# Patient Record
Sex: Male | Born: 1955 | Race: White | Hispanic: No | Smoking: Never smoker
Health system: Southern US, Community
[De-identification: ages and names within clinical notes are randomized; demographics above are authoritative.]

## PROBLEM LIST (undated history)

## (undated) DIAGNOSIS — M75102 Unspecified rotator cuff tear or rupture of left shoulder, not specified as traumatic: Secondary | ICD-10-CM

## (undated) DIAGNOSIS — K2970 Gastritis, unspecified, without bleeding: Secondary | ICD-10-CM

## (undated) DIAGNOSIS — K219 Gastro-esophageal reflux disease without esophagitis: Secondary | ICD-10-CM

## (undated) DIAGNOSIS — K259 Gastric ulcer, unspecified as acute or chronic, without hemorrhage or perforation: Secondary | ICD-10-CM

## (undated) DIAGNOSIS — E785 Hyperlipidemia, unspecified: Secondary | ICD-10-CM

## (undated) HISTORY — PX: INGUINAL HERNIA REPAIR: SUR1180

## (undated) HISTORY — DX: Hyperlipidemia, unspecified: E78.5

---

## 2001-11-06 ENCOUNTER — Ambulatory Visit (HOSPITAL_COMMUNITY): Admission: RE | Admit: 2001-11-06 | Discharge: 2001-11-06 | Payer: Self-pay | Admitting: Family Medicine

## 2001-11-06 ENCOUNTER — Encounter: Payer: Self-pay | Admitting: Family Medicine

## 2010-04-18 ENCOUNTER — Encounter: Payer: Self-pay | Admitting: Sports Medicine

## 2010-04-18 ENCOUNTER — Ambulatory Visit (INDEPENDENT_AMBULATORY_CARE_PROVIDER_SITE_OTHER): Payer: BC Managed Care – PPO | Admitting: Sports Medicine

## 2010-04-18 DIAGNOSIS — M79609 Pain in unspecified limb: Secondary | ICD-10-CM

## 2010-04-26 NOTE — Assessment & Plan Note (Signed)
Summary: NP,POST PT,L LEG INJURY,MC   Vital Signs:  Patient profile:   55 year old male Height:      74 inches Weight:      195 pounds BMI:     25.13 Pulse rate:   79 / minute BP sitting:   123 / 79  Vitals Entered By: Rochele Pages RN (April 18, 2010 12:25 PM) CC: L medial upper leg pain   CC:  L medial upper leg pain.  History of Present Illness: Left medial thigh pain since June 2011- started the next morning after a soccer practice.  Saw [facility]- who sent him to PT for 6 weeks, now doing HEP- feels he has improved only slightly with this.   Still feels pain in inner lt thigh with sudden stablization movements.  Feels pain with certain movements in  [place].  Running does not cause pain, cycling does not cause pain.    Preventive Screening-Counseling & Management  Alcohol-Tobacco     Smoking Status: never  Social History: Smoking Status:  never  Physical Exam  General:  Well-developed,well-nourished,in no acute distress; alert,appropriate and cooperative throughout examination Msk:  Hip rotation good bilat FABER tight bilat Lt Hip flexion strength good Lt hip abduction strength good Weak on adduction lt HS strength good bilat Sartorius strength good bilat   Additional Exam:   musculoskeletal ultrasound shows a gap at the insertion of the abductor tendon into the pubic bone. this is hypoechoic with a significant amount of change seen alone and trans view.    there is a small avulsion spur noted at the insertion of the tendon in the area of probable tear. There is also significant increase in Doppler flow seen throughout the area of injury.   Impression & Recommendations:  Problem # 1:  THIGH PAIN (ICD-729.5)  Orders: Garment,belt,sleeve or other covering ,elastic or similar stretch (Z6109) [place] LIMITED (60454)   we will try him on a nitroglycerin protocol. Have him continue to do some limited exercises for the hip girdle. Recheck this in 4 weeks with a repeat  scan.  Problem # 2:  PAIN IN SOFT TISSUES OF LIMB (ICD-729.5)   he also has been in orthotics for number of years. he has a flattened arch and used to get lower leg and foot pain. the orthotics are one bilaterally and are causing him some right forefoot pain. we tried him in a sports insole which was comfortable and allowed him to have a neutral gait with running.  Orders: Sports Insoles [id number])  Complete Medication List: 1)  Nitroglycerin 0.2 Mg/hr Pt24 (Nitroglycerin) .... Apply 1/4 patch to affected area daily.  change patch every 24 hours. Prescriptions: NITROGLYCERIN 0.2 MG/HR PT24 (NITROGLYCERIN) Apply 1/4 patch to affected area daily.  Change patch every 24 hours.  #30 x 1   Entered and Authorized by:   Enid Baas MD   Signed by:   Enid Baas MD on 04/18/2010   Method used:   Electronically to        [facility] [phone]* (retail)       [address]       [place], [place]  [address]       Ph: [phone] or [phone]       Fax: [phone]   RxID:   [id number]    Orders Added: 1)  Garment,belt,sleeve or other covering ,elastic or similar stretch [A4466] 2)  New Patient Level II [99202] 3)  [place] LIMITED [76882] 4)  Sports Insoles [L3510]

## 2010-06-02 ENCOUNTER — Ambulatory Visit (INDEPENDENT_AMBULATORY_CARE_PROVIDER_SITE_OTHER): Payer: BC Managed Care – PPO | Admitting: Sports Medicine

## 2010-06-02 VITALS — BP 114/74 | HR 64

## 2010-06-02 DIAGNOSIS — IMO0002 Reserved for concepts with insufficient information to code with codable children: Secondary | ICD-10-CM

## 2010-06-02 DIAGNOSIS — S76219A Strain of adductor muscle, fascia and tendon of unspecified thigh, initial encounter: Secondary | ICD-10-CM

## 2010-06-02 NOTE — Assessment & Plan Note (Signed)
Continue NTG patches, stretches, body helix sleeve. Resume light running. Follow up in 2 months.

## 2010-06-02 NOTE — Progress Notes (Signed)
  Subjective:    Patient ID: Brent Chapman, male    DOB: 05-28-1955, 55 y.o.   MRN: [id number]  HPI  1) Left adductor strain: Here for follow up. Last seen 6 weeks ago for left medial upper thigh pain since June 2011. Started on NTG patches, reports mild headache at times with this. Reports 60% improvement in pain. Able to start running and biking again. Continues to do physical therapy. Pain exacerbated by adduction, external rotation type movements.   Review of Systems     Objective:   Physical Exam General: Well-developed,well-nourished,in no acute distress; alert,appropriate and cooperative throughout examination  Msk:  Tender to palpation at insertion of adductor tendon at left groin.  Good strength with hip abduction bilaterally Decreased strength left as compared to right hip adduction. Guarding with hip external rotation on left past 30 degrees. 45 degrees hip external rotation on right. 15 degrees internal rotation on left, 30 degrees on right  Good hip flexion and extension bilaterally  U/S: gap at the insertion of the adductor tendon. Somewhat decreased amount of fluid seen. Continued increased Doppler flow noted.  Small avulsion spur noted at the insertion of the tendon in the area of probable tear.      Assessment & Plan:

## 2011-01-10 ENCOUNTER — Ambulatory Visit (INDEPENDENT_AMBULATORY_CARE_PROVIDER_SITE_OTHER): Payer: BC Managed Care – PPO | Admitting: Sports Medicine

## 2011-01-10 VITALS — BP 118/78

## 2011-01-10 DIAGNOSIS — S76319A Strain of muscle, fascia and tendon of the posterior muscle group at thigh level, unspecified thigh, initial encounter: Secondary | ICD-10-CM | POA: Insufficient documentation

## 2011-01-10 DIAGNOSIS — M75102 Unspecified rotator cuff tear or rupture of left shoulder, not specified as traumatic: Secondary | ICD-10-CM

## 2011-01-10 DIAGNOSIS — IMO0002 Reserved for concepts with insufficient information to code with codable children: Secondary | ICD-10-CM

## 2011-01-10 DIAGNOSIS — M67919 Unspecified disorder of synovium and tendon, unspecified shoulder: Secondary | ICD-10-CM

## 2011-01-10 HISTORY — DX: Unspecified rotator cuff tear or rupture of left shoulder, not specified as traumatic: M75.102

## 2011-01-10 NOTE — Progress Notes (Signed)
  Subjective:    Patient ID: Brent Chapman, male    DOB: 1955-11-07, 55 y.o.   MRN: [id number]  HPI  Has rt medial HS insertion pain x 2 months. Started the night before a 5k, pain continued after the race the next day.  Sitting, driving, going up stairs make the pain worse. Does not have rt knee swelling or giving out.  Has more pain when wearing shoes without good heel support.  Lt posterior shoulder pain x 2 months. Has pain with ER, reaching behind is a problem.     Review of Systems     Objective:   Physical Exam  NAD Leg lengths equal Neg shake home test Full motion of lt knee Lt knee exam normal   Full flexion of rt knee Neg Mcmurray's, Lachman, and collateral ligament test on rt Good rt HS strength with foot IR, ER, and neutral positions No sign of baker's cyst on exam on rt   Lt shoulder exam: ER and IR clicking in anterior capsule of lt shoulder Empty can neg on lt Resisted ER and IR strong Hawkins and neer's positive Back scratch on lt limited Push off test strong  O'brein's positive   MSK [place] RT post knee visualized No baker's cyst There is hypoechoic change at insertion of semimembranosus Insertion of gastroc norm Some bursal swelling under semimemb tendon insertion as well No tears No abnormal doppler      Assessment & Plan:

## 2011-01-10 NOTE — Assessment & Plan Note (Signed)
Given standard set of HS rehab exercises  Do daily  Reck at 6 weeks  Advised to try body helix compression wrap

## 2011-01-10 NOTE — Patient Instructions (Signed)
Please do suggested exercises for hamstring and shoulder daily  Please follow up in 6 weeks.  Use lift in shoes  OK to try easy running  Thank you for seeing [place] today.

## 2011-01-10 NOTE — Assessment & Plan Note (Signed)
Begin with a basic rehab program Keep elevation to 90 deg or less Prn NSAIDs Reck on RTC  If not improving we will ck MSK [place]

## 2011-01-28 ENCOUNTER — Other Ambulatory Visit: Payer: Self-pay | Admitting: Sports Medicine

## 2011-08-11 ENCOUNTER — Encounter: Payer: Self-pay | Admitting: Family Medicine

## 2011-08-11 ENCOUNTER — Ambulatory Visit (INDEPENDENT_AMBULATORY_CARE_PROVIDER_SITE_OTHER): Payer: BC Managed Care – PPO | Admitting: Family Medicine

## 2011-08-11 VITALS — BP 116/72 | Ht 74.0 in | Wt 195.0 lb

## 2011-08-11 DIAGNOSIS — M25579 Pain in unspecified ankle and joints of unspecified foot: Secondary | ICD-10-CM

## 2011-08-11 DIAGNOSIS — M25571 Pain in right ankle and joints of right foot: Secondary | ICD-10-CM

## 2011-08-11 NOTE — Assessment & Plan Note (Signed)
Ultrasound reassuring.  No obvious injury and no evidence of stress fracture.  Most consistent with post tib tendinopathy.  Continue orthotics, ASO provided for more support.  Icing, elevation, nsaids regularly.  Start rehab in 1 week (home exercises demonstrated).  Discussed return to running.  Cross train in meantime.

## 2011-08-11 NOTE — Patient Instructions (Addendum)
Your ultrasound is negative for a stress fracture. This is consistent however with increased fluid in your posterior tibialis tendon sheath consistent with posterior tibialis tendinopathy. Take ibuprofen 600 mg three times a day with food OR aleve 2 tabs twice a day with food for pain and inflammation for 1 week then as needed. Ice ankle 15 minutes at a time 3-4 times a day and after exercise. Cross train while this is painful with swimming or cycling (elliptical if this is not painful). Wear your orthotics regularly, ankle brace if this is comfortable also. After a week start the trash can and calf raise exercises I showed you - 3 sets of 10 most days of the week for 6 weeks. Follow up with me in 1 month to 6 weeks or as needed. Ok to resume running as long as not limping and pain is not worse than a 3 on a scale of 1-10.

## 2011-08-11 NOTE — Progress Notes (Signed)
  Subjective:    Patient ID: ROALD LUKACS, male    DOB: 1955/06/02, 56 y.o.   MRN: [id number]  PCP: Dr. Tiburcio Pea  HPI 56 yo M here for right ankle pain.  Patient reports he runs about 2 miles every other day as well as walks his dog daily. Day before swelling and pain started (wednesday 7/3) he ran around a grass field 2 miles and had walked his dog. No obvious injury but when he woke up next morning had pain medially along with swelling. No prior history of gout or inflammatory disease. Has been icing and using advil. No prior injury.  History reviewed. No pertinent past medical history.  No current outpatient prescriptions on file prior to visit.    History reviewed. No pertinent past surgical history.  No Known Allergies  History   Social History  . Marital Status: Married    Spouse Name: N/A    Number of Children: N/A  . Years of Education: N/A   Occupational History  . Not on file.   Social History Main Topics  . Smoking status: Never Smoker   . Smokeless tobacco: Not on file  . Alcohol Use: Not on file  . Drug Use: Not on file  . Sexually Active: Not on file   Other Topics Concern  . Not on file   Social History Narrative  . No narrative on file    Family History  Problem Relation Age of Onset  . Sudden death Neg Hx   . Hypertension Neg Hx   . Hyperlipidemia Neg Hx   . Heart attack Neg Hx   . Diabetes Neg Hx     BP 116/72  Ht 6\' 2"  (1.88 m)  Wt 195 lb (88.451 kg)  BMI 25.04 kg/m2  Review of Systems See HPI above.    Objective:   Physical Exam Gen: NAD  R ankle: Mod medial swelling but no bruising, warmth, erythema.  No other deformity. FROM with mild pain on IR, less on plantar flexion.  5/5 strength. TTP course of post tib tendon, deltoid ligament.  Minimal medial malleolus and medial talus TTP.  No other TTP about foot or ankle. Negative ant drawer and talar tilt.   Negative syndesmotic compression. Thompsons test negative. NV  intact distally.  L ankle: FROM without pain or swelling  MSK u/s: R ankle - target sign of post tib tendon with apparent soft tissue swelling superficial to this.  No irregularities, neovascularity, cortical edema of medial malleolus or talar dome.  Post tib tendon intact throughout course.  No other abnormalities.    Assessment & Plan:  1. Right ankle pain - Ultrasound reassuring.  No obvious injury and no evidence of stress fracture.  Most consistent with post tib tendinopathy.  Continue orthotics, ASO provided for more support.  Icing, elevation, nsaids regularly.  Start rehab in 1 week (home exercises demonstrated).  Discussed return to running.  Cross train in meantime.

## 2011-08-29 ENCOUNTER — Ambulatory Visit: Payer: BC Managed Care – PPO | Admitting: Sports Medicine

## 2011-09-06 ENCOUNTER — Ambulatory Visit (INDEPENDENT_AMBULATORY_CARE_PROVIDER_SITE_OTHER): Payer: BC Managed Care – PPO | Admitting: Family Medicine

## 2011-09-06 ENCOUNTER — Encounter: Payer: Self-pay | Admitting: Family Medicine

## 2011-09-06 VITALS — BP 119/75 | HR 66 | Ht 74.0 in | Wt 195.0 lb

## 2011-09-06 DIAGNOSIS — M75102 Unspecified rotator cuff tear or rupture of left shoulder, not specified as traumatic: Secondary | ICD-10-CM

## 2011-09-06 DIAGNOSIS — M719 Bursopathy, unspecified: Secondary | ICD-10-CM

## 2011-09-06 NOTE — Assessment & Plan Note (Signed)
2/2 rotator cuff impingement, subscap strain.  Continue home exercise program - handout provided and demonstrated exercises.  Declined PT (has done in past for right side).  Subacromial injection given today.  Consider nitro patches, further imaging if not improving over next 6 weeks as expected.  After informed written consent, patient was seated on exam table. Left shoulder was prepped with alcohol swab and utilizing posterior approach, patient's left subacromial space was injected with 3:1 marcaine: depomedrol. Patient tolerated the procedure well without immediate complications.

## 2011-09-06 NOTE — Patient Instructions (Addendum)
You have rotator cuff impingement and subscapularis strain (one of the rotator cuff muscles). Try to avoid painful activities (overhead activities, lifting with extended arm) as much as possible. Aleve and/or tylenol as needed for pain. Subacromial injection may be beneficial to help with pain and to decrease inflammation - you were given this today. Avoid heavy lifting (especially overhead) for 5-7 days. Do home exercise program with theraband and scapular stabilization exercises daily - these are very important for long term relief even if an injection was given - wait until the weekend to start these. If not improving at follow-up we will consider further imaging and/or nitro patches.

## 2011-09-06 NOTE — Progress Notes (Signed)
  Subjective:    Patient ID: Brent Chapman, male    DOB: 17-Mar-1955, 56 y.o.   MRN: [id number]  PCP: Dr. Tiburcio Pea  HPI 56 yo M here for left shoulder pain.  Patient reports back in September when with his daughter at college he recalls reaching out to side to pose for a picture (arm behind someone's back) and developed some soreness left shoulder after this. Has had pain mostly with overhead and reaching activities since. Pain worse when lying on left side. No swelling or bruising. Is right handed. Went to [employer] office - dx with rotator cuff syndrome and given home exercise program - had some improvement with this but pain persisting. Had prior issues with right shoulder and did PT - knows all exercises from this. Not waking up at night. Has not had cortisone shot or tried nitro patches for this.  Past Medical History  Diagnosis Date  . Hyperlipidemia     Current Outpatient Prescriptions on File Prior to Visit  Medication Sig Dispense Refill  . atorvastatin (LIPITOR) 20 MG tablet         History reviewed. No pertinent past surgical history.  No Known Allergies  History   Social History  . Marital Status: Married    Spouse Name: N/A    Number of Children: N/A  . Years of Education: N/A   Occupational History  . Not on file.   Social History Main Topics  . Smoking status: Never Smoker   . Smokeless tobacco: Not on file  . Alcohol Use: Not on file  . Drug Use: Not on file  . Sexually Active: Not on file   Other Topics Concern  . Not on file   Social History Narrative  . No narrative on file    Family History  Problem Relation Age of Onset  . Sudden death Neg Hx   . Hypertension Neg Hx   . Hyperlipidemia Neg Hx   . Heart attack Neg Hx   . Diabetes Neg Hx     BP 119/75  Pulse 66  Ht 6\' 2"  (1.88 m)  Wt 195 lb (88.451 kg)  BMI 25.04 kg/m2  Review of Systems See HPI above.    Objective:   Physical Exam Gen: NAD  L shoulder: No swelling,  ecchymoses.  No gross deformity. No TTP at [facility] or biceps tendon. FROM with painful arc. Positive Hawkins, Neers. Negative Speeds, Yergasons. Strength 5/5 with empty can and resisted internal/external rotation.  Pain with IR only. Negative apprehension. NV intact distally.  R shoulder: FROM without pain or weakness.    Assessment & Plan:  1. Left shoulder pain - 2/2 rotator cuff impingement, subscap strain.  Continue home exercise program - handout provided and demonstrated exercises.  Declined PT (has done in past for right side).  Subacromial injection given today.  Consider nitro patches, further imaging if not improving over next 6 weeks as expected.  After informed written consent, patient was seated on exam table. Left shoulder was prepped with alcohol swab and utilizing posterior approach, patient's left subacromial space was injected with 3:1 marcaine: depomedrol. Patient tolerated the procedure well without immediate complications.

## 2011-09-20 ENCOUNTER — Ambulatory Visit: Payer: BC Managed Care – PPO | Admitting: Sports Medicine

## 2012-11-13 ENCOUNTER — Ambulatory Visit (INDEPENDENT_AMBULATORY_CARE_PROVIDER_SITE_OTHER): Payer: BC Managed Care – PPO | Admitting: Family Medicine

## 2012-11-13 ENCOUNTER — Encounter: Payer: Self-pay | Admitting: Family Medicine

## 2012-11-13 VITALS — BP 107/76 | HR 83 | Ht 74.0 in | Wt 200.0 lb

## 2012-11-13 DIAGNOSIS — M79609 Pain in unspecified limb: Secondary | ICD-10-CM

## 2012-11-13 DIAGNOSIS — M79671 Pain in right foot: Secondary | ICD-10-CM

## 2012-11-13 NOTE — Patient Instructions (Signed)
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 Icing 15 minutes at a time 3-4 times a day as needed. Ibuprofen 600mg  three times a day with food OR aleve 2 tabs twice a day with food of pain and inflammation. Do home exercises 3 sets of 10 of each once a day for next 6 weeks. Dr. Jari Sportsman active series insoles - wear when running and even when walking if it hurts you doing this. Follow up with me in 6 weeks or as needed.

## 2012-11-14 ENCOUNTER — Encounter: Payer: Self-pay | Admitting: Family Medicine

## 2012-11-14 DIAGNOSIS — M79671 Pain in right foot: Secondary | ICD-10-CM | POA: Insufficient documentation

## 2012-11-14 NOTE — Assessment & Plan Note (Signed)
consistent with peroneal tendinopathy.  U/S reassuring, no bony tenderness.  Icing, nsaids.  Shown home exercise program.  Arch supports regularly when up and walking around.  Running as tolerated.  F/u in 6 weeks.

## 2012-11-14 NOTE — Progress Notes (Signed)
Patient ID: ELZIE KNISLEY, male   DOB: 1955/07/06, 57 y.o.   MRN: [id number]  PCP: [name], MD  Subjective:   HPI: Patient is a 57 y.o. male here for right foot pain.  Patient reports about 2 years ago ran in shoes that were too small - developed pain in lateral right foot, resolved with bigger shoes. Current pain started about 1 1/2 months ago. Noticed lateral foot pain worse with running. Has rested for 1 month but still bothers him with walking. No swelling, bruising. Has been icing, taking ibuprofen. No injury.  Past Medical History  Diagnosis Date  . Hyperlipidemia     Current Outpatient Prescriptions on File Prior to Visit  Medication Sig Dispense Refill  . atorvastatin (LIPITOR) 20 MG tablet        No current facility-administered medications on file prior to visit.    History reviewed. No pertinent past surgical history.  No Known Allergies  History   Social History  . Marital Status: Married    Spouse Name: N/A    Number of Children: N/A  . Years of Education: N/A   Occupational History  . Not on file.   Social History Main Topics  . Smoking status: Never Smoker   . Smokeless tobacco: Not on file  . Alcohol Use: Not on file  . Drug Use: Not on file  . Sexual Activity: Not on file   Other Topics Concern  . Not on file   Social History Narrative  . No narrative on file    Family History  Problem Relation Age of Onset  . Sudden death Neg Hx   . Hypertension Neg Hx   . Hyperlipidemia Neg Hx   . Heart attack Neg Hx   . Diabetes Neg Hx     BP 107/76  Pulse 83  Ht 6\' 2"  (1.88 m)  Wt 200 lb (90.719 kg)  BMI 25.67 kg/m2  Review of Systems: See HPI above.    Objective:  Physical Exam:  Gen: NAD  Right foot/ankle: No gross deformity, swelling, ecchymoses.  Mod overpronation. FROM with pain on ext rotation mildly.  Strength 5/5 all directions. TTP just proximal to insertion of peroneus brevis on base 5th.  No cuboid, other  TTP. Negative ant drawer and talar tilt.   Negative syndesmotic compression. Negative metatarsal squeeze. Thompsons test negative. NV intact distally.    MSK u/s:  No cortical irregularity, neovascularity, edema overlying cortex of right 5th MT.  Assessment & Plan:  1. Right foot pain - consistent with peroneal tendinopathy.  U/S reassuring, no bony tenderness.  Icing, nsaids.  Shown home exercise program.  Arch supports regularly when up and walking around.  Running as tolerated.  F/u in 6 weeks.

## 2013-12-11 ENCOUNTER — Encounter (HOSPITAL_COMMUNITY): Payer: Self-pay | Admitting: *Deleted

## 2013-12-11 ENCOUNTER — Inpatient Hospital Stay (HOSPITAL_COMMUNITY)
Admission: EM | Admit: 2013-12-11 | Discharge: 2013-12-13 | DRG: 379 | Disposition: A | Discharge status: Home / Self Care | Payer: BC Managed Care – PPO | Attending: Internal Medicine | Admitting: Internal Medicine

## 2013-12-11 DIAGNOSIS — K625 Hemorrhage of anus and rectum: Secondary | ICD-10-CM | POA: Diagnosis present

## 2013-12-11 DIAGNOSIS — K269 Duodenal ulcer, unspecified as acute or chronic, without hemorrhage or perforation: Secondary | ICD-10-CM | POA: Diagnosis present

## 2013-12-11 DIAGNOSIS — I959 Hypotension, unspecified: Secondary | ICD-10-CM | POA: Diagnosis present

## 2013-12-11 DIAGNOSIS — K264 Chronic or unspecified duodenal ulcer with hemorrhage: Principal | ICD-10-CM | POA: Diagnosis present

## 2013-12-11 DIAGNOSIS — Z79899 Other long term (current) drug therapy: Secondary | ICD-10-CM

## 2013-12-11 DIAGNOSIS — K922 Gastrointestinal hemorrhage, unspecified: Secondary | ICD-10-CM

## 2013-12-11 DIAGNOSIS — K449 Diaphragmatic hernia without obstruction or gangrene: Secondary | ICD-10-CM

## 2013-12-11 DIAGNOSIS — D696 Thrombocytopenia, unspecified: Secondary | ICD-10-CM | POA: Diagnosis present

## 2013-12-11 DIAGNOSIS — D62 Acute posthemorrhagic anemia: Secondary | ICD-10-CM | POA: Diagnosis present

## 2013-12-11 DIAGNOSIS — E785 Hyperlipidemia, unspecified: Secondary | ICD-10-CM | POA: Diagnosis present

## 2013-12-11 DIAGNOSIS — K2981 Duodenitis with bleeding: Secondary | ICD-10-CM | POA: Diagnosis present

## 2013-12-11 DIAGNOSIS — K921 Melena: Secondary | ICD-10-CM | POA: Diagnosis present

## 2013-12-11 HISTORY — DX: Unspecified rotator cuff tear or rupture of left shoulder, not specified as traumatic: M75.102

## 2013-12-11 HISTORY — DX: Gastritis, unspecified, without bleeding: K29.70

## 2013-12-11 LAB — CBC
HCT: 27.6 % — ABNORMAL LOW (ref 39.0–52.0)
Hemoglobin: 9.6 g/dL — ABNORMAL LOW (ref 13.0–17.0)
MCH: 31.9 pg (ref 26.0–34.0)
MCHC: 34.8 g/dL (ref 30.0–36.0)
MCV: 91.7 fL (ref 78.0–100.0)
PLATELETS: 106 10*3/uL — AB (ref 150–400)
RBC: 3.01 MIL/uL — ABNORMAL LOW (ref 4.22–5.81)
RDW: 12.3 % (ref 11.5–15.5)
WBC: 5.3 10*3/uL (ref 4.0–10.5)

## 2013-12-11 LAB — HEMOGLOBIN AND HEMATOCRIT, BLOOD
HEMATOCRIT: 29.5 % — AB (ref 39.0–52.0)
HEMATOCRIT: 27.9 % — AB (ref 39.0–52.0)
HEMOGLOBIN: 9.7 g/dL — AB (ref 13.0–17.0)
Hemoglobin: 10.1 g/dL — ABNORMAL LOW (ref 13.0–17.0)

## 2013-12-11 LAB — ABO/RH: ABO/RH(D): A POS

## 2013-12-11 LAB — HEPATIC FUNCTION PANEL
ALK PHOS: 33 U/L — AB (ref 39–117)
ALT: 15 U/L (ref 0–53)
AST: 16 U/L (ref 0–37)
Albumin: 2.8 g/dL — ABNORMAL LOW (ref 3.5–5.2)
BILIRUBIN TOTAL: 0.4 mg/dL (ref 0.3–1.2)
Total Protein: 4.9 g/dL — ABNORMAL LOW (ref 6.0–8.3)

## 2013-12-11 LAB — TYPE AND SCREEN
ABO/RH(D): A POS
ANTIBODY SCREEN: NEGATIVE

## 2013-12-11 LAB — BASIC METABOLIC PANEL
ANION GAP: 7 (ref 5–15)
BUN: 37 mg/dL — ABNORMAL HIGH (ref 6–23)
CHLORIDE: 109 meq/L (ref 96–112)
CO2: 25 mEq/L (ref 19–32)
Calcium: 7.6 mg/dL — ABNORMAL LOW (ref 8.4–10.5)
Creatinine, Ser: 0.99 mg/dL (ref 0.50–1.35)
GFR, EST NON AFRICAN AMERICAN: 90 mL/min — AB (ref >90)
Glucose, Bld: 99 mg/dL (ref 70–99)
POTASSIUM: 5 meq/L (ref 3.7–5.3)
SODIUM: 141 meq/L (ref 137–147)

## 2013-12-11 LAB — TROPONIN I

## 2013-12-11 LAB — POC OCCULT BLOOD, ED: Fecal Occult Bld: POSITIVE — AB

## 2013-12-11 MED ORDER — ACETAMINOPHEN 325 MG PO TABS
650.0000 mg | ORAL_TABLET | Freq: Four times a day (QID) | ORAL | Status: DC | PRN
  Administered 2013-12-12: 650 mg via ORAL
  Filled 2013-12-11: qty 2

## 2013-12-11 MED ORDER — SODIUM CHLORIDE 0.9 % IV SOLN: INTRAVENOUS | Status: DC

## 2013-12-11 MED ORDER — PROMETHAZINE HCL 25 MG PO TABS: 12.5000 mg | ORAL_TABLET | Freq: Four times a day (QID) | ORAL | Status: DC | PRN

## 2013-12-11 MED ORDER — ACETAMINOPHEN 650 MG RE SUPP
650.0000 mg | Freq: Four times a day (QID) | RECTAL | Status: DC | PRN
  Administered 2013-12-11: 650 mg via RECTAL
  Filled 2013-12-11: qty 1

## 2013-12-11 MED ORDER — PANTOPRAZOLE SODIUM 40 MG IV SOLR
40.0000 mg | Freq: Once | INTRAVENOUS | Status: AC
  Administered 2013-12-11: 40 mg via INTRAVENOUS
  Filled 2013-12-11: qty 40

## 2013-12-11 MED ORDER — SODIUM CHLORIDE 0.9 % IV SOLN
INTRAVENOUS | Status: DC
  Administered 2013-12-11: 16:00:00 via INTRAVENOUS
  Administered 2013-12-12: 1000 mL via INTRAVENOUS
  Administered 2013-12-12: 250 mL via INTRAVENOUS
  Administered 2013-12-12: 02:00:00 via INTRAVENOUS

## 2013-12-11 MED ORDER — ATORVASTATIN CALCIUM 20 MG PO TABS
20.0000 mg | ORAL_TABLET | Freq: Every day | ORAL | Status: DC
  Administered 2013-12-12: 20 mg via ORAL
  Filled 2013-12-11 (×3): qty 1

## 2013-12-11 MED ORDER — PANTOPRAZOLE SODIUM 40 MG IV SOLR
40.0000 mg | Freq: Two times a day (BID) | INTRAVENOUS | Status: DC
  Administered 2013-12-11 – 2013-12-13 (×4): 40 mg via INTRAVENOUS
  Filled 2013-12-11 (×5): qty 40

## 2013-12-11 MED ORDER — SODIUM CHLORIDE 0.9 % IV BOLUS (SEPSIS)
1000.0000 mL | Freq: Once | INTRAVENOUS | Status: AC
  Administered 2013-12-11: 1000 mL via INTRAVENOUS

## 2013-12-11 NOTE — Consult Note (Signed)
Reason for Consult: Bloody stools/?Melena and anemia. Referring Physician: THP  Brent Chapman is an 58 y.o. male.  HPI: 58 year old white male, who was in his usual state of health till 3 days ago, when he started having nausea and vomiting after dinner. He claims he vomited several times that night but denies having any CGE or hematemesis. He did not feel well on Tuesday 12/09/13 and stayed home from work. He works as an Automotive engineer. Yesterday he started having diarrhea where he passed "purple colored stool". He has several such loose BM's. He denies having any syncope or near syncope till he visited his PCP this afternoon and was found to be hypotensive on exam with a pre-syncopal episode and was then transferred to [facility] by EMS. He has not had a BM since 6 AM today. He denies having a previous history of ulcers. He uses Advil or Aleve occasionally for aches and pain but does not take large doses of NSAIDS. He denies having any problems with reflux, dysphagia or odynophagia. He gives a history of having a colonoscopy about 6 years ago when he had a couple of polyps removed. This reportedly was done at [facility]. He denies a family history of colon or stomach cancer.   Past Medical History  Diagnosis Date  . Hyperlipidemia   . Gastritis     NSAID induced  . Rotator cuff syndrome of left shoulder 01/10/2011    Classic impingement findings on exam    History reviewed. No pertinent past surgical history.  Family History  Problem Relation Age of Onset  . Sudden death Neg Hx   . Hypertension Neg Hx   . Hyperlipidemia Neg Hx   . Heart attack Neg Hx   . Diabetes Neg Hx   . Cancer Father     Prostate   Social History:  reports that he has never smoked. He has never used smokeless tobacco. He reports that he drinks alcohol. He reports that he does not use illicit drugs. He drinks 1-2 beers per week.   Allergies: No Known Allergies  Medications: I have reviewed the  patient's current medications.  Results for orders placed or performed during the hospital encounter of 12/11/13 (from the past 48 hour(s))  CBC     Status: Abnormal   Collection Time: 12/11/13 11:10 AM  Result Value Ref Range   WBC 5.3 4.0 - 10.5 K/uL   RBC 3.01 (L) 4.22 - 5.81 MIL/uL   Hemoglobin 9.6 (L) 13.0 - 17.0 g/dL   HCT 27.6 (L) 39.0 - 52.0 %   MCV 91.7 78.0 - 100.0 fL   MCH 31.9 26.0 - 34.0 pg   MCHC 34.8 30.0 - 36.0 g/dL   RDW 12.3 11.5 - 15.5 %   Platelets 106 (L) 150 - 400 K/uL    Comment: SPECIMEN CHECKED FOR CLOTS REPEATED TO VERIFY PLATELET COUNT CONFIRMED BY SMEAR   Basic metabolic panel     Status: Abnormal   Collection Time: 12/11/13 11:10 AM  Result Value Ref Range   Sodium 141 137 - 147 mEq/L   Potassium 5.0 3.7 - 5.3 mEq/L   Chloride 109 96 - 112 mEq/L   CO2 25 19 - 32 mEq/L   Glucose, Bld 99 70 - 99 mg/dL   BUN 37 (H) 6 - 23 mg/dL   Creatinine, Ser 0.99 0.50 - 1.35 mg/dL   Calcium 7.6 (L) 8.4 - 10.5 mg/dL   GFR calc non Af Amer 90 (L) >  90 mL/min   GFR calc Af Amer >90 >90 mL/min    Comment: (NOTE) The eGFR has been calculated using the CKD EPI equation. This calculation has not been validated in all clinical situations. eGFR's persistently <90 mL/min signify possible Chronic Kidney Disease.    Anion gap 7 5 - 15  Troponin I     Status: None   Collection Time: 12/11/13 11:10 AM  Result Value Ref Range   Troponin I <0.30 <0.30 ng/mL    Comment:        Due to the release kinetics of cTnI, a negative result within the first hours of the onset of symptoms does not rule out myocardial infarction with certainty. If myocardial infarction is still suspected, repeat the test at appropriate intervals.   Hepatic function panel     Status: Abnormal   Collection Time: 12/11/13 11:10 AM  Result Value Ref Range   Total Protein 4.9 (L) 6.0 - 8.3 g/dL   Albumin 2.8 (L) 3.5 - 5.2 g/dL   AST 16 0 - 37 U/L   ALT 15 0 - 53 U/L   Alkaline Phosphatase 33 (L)  39 - 117 U/L   Total Bilirubin 0.4 0.3 - 1.2 mg/dL   Bilirubin, Direct <0.2 0.0 - 0.3 mg/dL   Indirect Bilirubin NOT CALCULATED 0.3 - 0.9 mg/dL  Type and screen for Red Blood Exchange     Status: None   Collection Time: 12/11/13 11:11 AM  Result Value Ref Range   ABO/RH(D) A POS    Antibody Screen NEG    Sample Expiration 12/14/2013   ABO/Rh     Status: None   Collection Time: 12/11/13 11:11 AM  Result Value Ref Range   ABO/RH(D) A POS   POC occult blood, ED Provider will collect     Status: Abnormal   Collection Time: 12/11/13 11:16 AM  Result Value Ref Range   Fecal Occult Bld POSITIVE (A) NEGATIVE  Hemoglobin and hematocrit, blood     Status: Abnormal   Collection Time: 12/11/13  2:36 PM  Result Value Ref Range   Hemoglobin 10.1 (L) 13.0 - 17.0 g/dL   HCT 29.5 (L) 39.0 - 52.0 %  Review of Systems  Constitutional: Negative.   HENT: Negative.   Eyes: Negative.   Respiratory: Negative.   Cardiovascular: Negative.   Gastrointestinal: Positive for nausea, vomiting, diarrhea, blood in stool and melena. Negative for abdominal pain.  Genitourinary: Negative.   Musculoskeletal: Negative.   Neurological: Negative.   Endo/Heme/Allergies: Negative.   Psychiatric/Behavioral: Negative.    Blood pressure 116/64, pulse 65, temperature 98.5 F (36.9 C), temperature source Oral, resp. rate 10, height 6' 2"  (1.88 m), weight 85.911 kg (189 lb 6.4 oz), SpO2 100 %. Physical Exam  Constitutional: He is oriented to person, place, and time. He appears well-developed and well-nourished.  HENT:  Head: Normocephalic and atraumatic.  Eyes: Conjunctivae and EOM are normal. Pupils are equal, round, and reactive to light.  Neck: Normal range of motion. Neck supple.  Cardiovascular: Normal rate and regular rhythm.   Respiratory: Effort normal and breath sounds normal.  GI: Soft. Bowel sounds are normal. He exhibits no distension and no mass. There is no tenderness. There is no rebound and no  guarding.  Musculoskeletal: Normal range of motion.  Neurological: He is alert and oriented to person, place, and time.  Skin: Skin is warm and dry.  Psychiatric: He has a normal mood and affect. His behavior is normal. Judgment and thought content normal.  Assessment/Plan: 1) Bloody stools with anemia, elevated BUN and hypotension: will allow the patient some clears and plan on doing an EGD tomorrow-to rule out PUD vs Dieulafoy's etc. 2) History of colonic polyps.  3) Thrombocytopenia.  4) Hyperlipidemia.  Malloree Raboin 12/11/2013, 7:58 PM

## 2013-12-11 NOTE — ED Notes (Signed)
Per EMS, [facility].   Pt had diarrhea with n/v on Sunday.  Since yesterday, he has been having diarrhea with bleeding.  Went to MD today.  Pt was hypotensive.  D/T symptoms, pt transported via ambulance to [facility].  Vitals 90/68, increase to 113/70, hr 64, resp 18.  cbg 117.  250 cc saline in 20 g LAC.

## 2013-12-11 NOTE — ED Notes (Signed)
MD at bedside. 

## 2013-12-11 NOTE — ED Provider Notes (Signed)
CSN: [id number]     Arrival date & time 12/11/13  1038 History   First MD Initiated Contact with Patient 12/11/13 1044     Chief Complaint  Patient presents with  . Blood In Stools  . Hypotension     (Consider location/radiation/quality/duration/timing/severity/associated sxs/prior Treatment) Patient is a 58 y.o. male presenting with hematochezia.  Rectal Bleeding Quality:  Maroon Amount:  Moderate Duration:  24 hours Timing:  Constant Progression:  Worsening Chronicity:  New Context comment:  Took a few NSAIDs this weekend.  Vomiting started 4 days ago, then resolved.  Nausea remained.  melanic stools started yesterday Relieved by:  Nothing Worsened by:  Defecation Associated symptoms: dizziness (near syncope at doctor's office earlier today) and vomiting   Associated symptoms: no fever and no hematemesis   Risk factors: no anticoagulant use     Past Medical History  Diagnosis Date  . Hyperlipidemia    History reviewed. No pertinent past surgical history. Family History  Problem Relation Age of Onset  . Sudden death Neg Hx   . Hypertension Neg Hx   . Hyperlipidemia Neg Hx   . Heart attack Neg Hx   . Diabetes Neg Hx    History  Substance Use Topics  . Smoking status: Never Smoker   . Smokeless tobacco: Not on file  . Alcohol Use: Not on file    Review of Systems  Constitutional: Negative for fever.  Gastrointestinal: Positive for vomiting and hematochezia. Negative for hematemesis.  Neurological: Positive for dizziness (near syncope at doctor's office earlier today).  All other systems reviewed and are negative.     Allergies  Review of patient's allergies indicates no known allergies.  Home Medications   Prior to Admission medications   Medication Sig Start Date End Date Taking? Authorizing Provider  atorvastatin (LIPITOR) 20 MG tablet  08/25/11   Historical Provider, MD   BP 115/72 mmHg  Pulse 59  Temp(Src) 97.4 F (36.3 C) (Oral)  Resp 11  SpO2  100% Physical Exam  Constitutional: He is oriented to person, place, and time. He appears well-developed and well-nourished. No distress.  HENT:  Head: Normocephalic and atraumatic.  Mouth/Throat: Oropharynx is clear and moist.  Eyes: Conjunctivae are normal. Pupils are equal, round, and reactive to light. No scleral icterus.  Neck: Neck supple.  Cardiovascular: Normal rate, regular rhythm, normal heart sounds and intact distal pulses.   No murmur heard. Pulmonary/Chest: Effort normal and breath sounds normal. No stridor. No respiratory distress. He has no wheezes. He has no rales.  Abdominal: Soft. He exhibits no distension. There is tenderness (minimal) in the epigastric area. There is no rebound and no guarding.  Genitourinary: Rectal exam shows no mass and no tenderness. Guaiac positive stool (maroon stool).  Musculoskeletal: Normal range of motion. He exhibits no edema.  Neurological: He is alert and oriented to person, place, and time.  Skin: Skin is warm and dry. No rash noted.  Psychiatric: He has a normal mood and affect. His behavior is normal.  Vitals reviewed.   ED Course  Procedures (including critical care time) Labs Review Labs Reviewed  CBC - Abnormal; Notable for the following:    RBC 3.01 (*)    Hemoglobin 9.6 (*)    HCT 27.6 (*)    Platelets 106 (*)    All other components within normal limits  BASIC METABOLIC PANEL - Abnormal; Notable for the following:    BUN 37 (*)    Calcium 7.6 (*)    GFR  calc non Af Amer 90 (*)    All other components within normal limits  HEPATIC FUNCTION PANEL - Abnormal; Notable for the following:    Total Protein 4.9 (*)    Albumin 2.8 (*)    Alkaline Phosphatase 33 (*)    All other components within normal limits  HEMOGLOBIN AND HEMATOCRIT, BLOOD - Abnormal; Notable for the following:    Hemoglobin 10.1 (*)    HCT 29.5 (*)    All other components within normal limits  POC OCCULT BLOOD, ED - Abnormal; Notable for the following:     Fecal Occult Bld POSITIVE (*)    All other components within normal limits  TROPONIN I  HEMOGLOBIN AND HEMATOCRIT, BLOOD  HEMOGLOBIN AND HEMATOCRIT, BLOOD  TYPE AND SCREEN  ABO/RH    Imaging Review No results found.   EKG Interpretation   Date/Time:  Thursday December 11 2013 10:45:55 EST Ventricular Rate:  53 PR Interval:  159 QRS Duration: 113 QT Interval:  426 QTC Calculation: 400 R Axis:   86 Text Interpretation:  Sinus rhythm Borderline intraventricular conduction  delay nonspecific ST abnormality No old tracing to compare Confirmed by  [facility]  MD, TREY ([id number]) on 12/11/2013 12:20:37 PM      MDM   Final diagnoses:  Acute upper GI bleed  Acute blood loss anemia    58 yo male presenting with about 12 hours of maroon colored stool.  Became light headed and hypotensive at PCP's office.  No history of GI bleeding.  Stool is melanic on exam.  Fluids, IV protonix, labs pending.    Labs show Hg of 9.6, unknown baseline but no history or anemia.  I discussed his case with Dr. Darnelle Catalanama (Internal Medicine) who will admit and Dr. Loreta AveMann (GI) who will consult.    Warnell Foresterrey Mee Macdonnell, MD 12/11/13 [phone]

## 2013-12-11 NOTE — ED Notes (Signed)
Hemoccult POSITIVE 

## 2013-12-11 NOTE — Plan of Care (Signed)
Problem: Phase I Progression Outcomes Goal: Voiding-avoid urinary catheter unless indicated Outcome: Not Applicable Date Met:  65/46/50 Voiding well.

## 2013-12-11 NOTE — Plan of Care (Signed)
Problem: Consults Goal: Skin Care Protocol Initiated - if Braden Score 18 or less If consults are not indicated, leave blank or document N/A  Outcome: Not Applicable Date Met:  12/11/13     

## 2013-12-11 NOTE — Plan of Care (Signed)
Problem: Consults Goal: Diabetes Guidelines if Diabetic/Glucose > 140 If diabetic or lab glucose is > 140 mg/dl - Initiate Diabetes/Hyperglycemia Guidelines & Document Interventions  Outcome: Not Applicable Date Met:  91/50/41

## 2013-12-11 NOTE — Plan of Care (Signed)
Problem: Consults Goal: Nutrition Consult-if indicated Outcome: Progressing Pt is now NPO, but has lost some weigh because of lack of appetite.

## 2013-12-11 NOTE — H&P (Signed)
History and Physical:    Brent CollardWilliam L Ghuman ZOX:[id number] DOB: 30-Jun-1955 DOA: 12/11/2013  Referring physician: Dr. Loretha StaplerWofford PCP: [name], Ravon, MD   Chief Complaint: Bloody stools  History of Present Illness:   Brent Chapman is an 58 y.o. male with a PMH of NSAID induced gastritis about 6 years ago and hyperlipidemia who presented to the ER today with a chief complaint of bloody stools.  The patient reports that he took 2 Aleve on 12/06/13 and one on 11/1 and 11/2.  The patient noticed that he had some blood in the stools and nausea/vomiting/dry heaves over the past 48 hours.  He then developed frequent episodes of diarrhea with bloody stools overnight.  Made an appointment to see his PCP today and was sent here after he became pre-syncopal in MD office.  No other history of ASA use.    ROS:   Constitutional: No fever, + chills;  Appetite diminished; No weight loss, no weight gain, no fatigue.  HEENT: No blurry vision, no diplopia, no pharyngitis, no dysphagia CV: No chest pain, no palpitations, no PND, no orthopnea, no edema.  Resp: No SOB, no cough, no pleuritic pain. GI: + nausea, + vomiting, + diarrhea, no melena, + hematochezia, no constipation, no abdominal pain.  GU: No dysuria, no hematuria, no frequency, no urgency. MSK: no myalgias, no arthralgias.  Neuro:  No headache, no focal neurological deficits, no history of seizures.  Psych: No depression, no anxiety.  Endo: No heat intolerance, no cold intolerance, no polyuria, no polydipsia  Skin: No rashes, no skin lesions.  Heme: No easy bruising.  Travel history: No recent travel.   Past Medical History:   Past Medical History  Diagnosis Date  . Hyperlipidemia   . Gastritis     NSAID induced  . Rotator cuff syndrome of left shoulder 01/10/2011    Classic impingement findings on exam     Past Surgical History:   History reviewed. No pertinent past surgical history.  Social History:   History   Social History  .  Marital Status: Married    Spouse Name: Marjean Donnaileen    Number of Children: 2  . Years of Education: N/A   Occupational History  . Elementary school teacher    Social History Main Topics  . Smoking status: Never Smoker   . Smokeless tobacco: Never Used  . Alcohol Use: 0.0 oz/week    0 Not specified per week     Comment: 2 beers a week.  . Drug Use: No  . Sexual Activity: Not Currently   Other Topics Concern  . Not on file   Social History Narrative   Married.  Independent of ADLs.    Family history:   Family History  Problem Relation Age of Onset  . Sudden death Neg Hx   . Hypertension Neg Hx   . Hyperlipidemia Neg Hx   . Heart attack Neg Hx   . Diabetes Neg Hx   . Cancer Father     Prostate    Allergies   Review of patient's allergies indicates no known allergies.  Current Medications:   Prior to Admission medications   Medication Sig Start Date End Date Taking? Authorizing Provider  atorvastatin (LIPITOR) 20 MG tablet 20 mg daily.  08/25/11  Yes Historical Provider, MD  sildenafil (VIAGRA) 25 MG tablet Take 100 mg by mouth daily as needed for erectile dysfunction.   Yes Historical Provider, MD  TURMERIC PO Take 1 capsule by mouth daily.  Yes Historical Provider, MD    Physical Exam:   Filed Vitals:   12/11/13 1323 12/11/13 1330 12/11/13 1331 12/11/13 1402  BP: 111/70 107/67    Pulse: 69 73 60 65  Temp:      TempSrc:      Resp: 14 0 9 16  SpO2: 99% 100% 100% 100%     Physical Exam: Blood pressure 107/67, pulse 65, temperature 97.4 F (36.3 C), temperature source Oral, resp. rate 16, SpO2 100 %. Gen: No acute distress. Head: Normocephalic, atraumatic. Eyes: PERRL, EOMI, sclerae nonicteric. Mouth: Oropharynx Clear. No posterior pharyngeal exudates. Neck: Supple, no thyromegaly, no lymphadenopathy, no jugular venous distention. Chest: Lungs clear to auscultation bilaterally. CV: Heart sounds are regular. No murmurs, rubs, or gallops. Abdomen: Soft,  nontender, nondistended with normal active bowel sounds. Rectal: Deferred, done by EDP which showed no masses or tenderness and guaiac positive stool. Extremities: Extremities are without clubbing, edema, or cyanosis. Skin: Warm and dry. Neuro: Alert and oriented times 3; cranial nerves II through XII grossly intact. Psych: Mood and affect normal.   Data Review:    Labs: Basic Metabolic Panel:  Recent Labs Lab 12/11/13 1110  NA 141  K 5.0  CL 109  CO2 25  GLUCOSE 99  BUN 37*  CREATININE 0.99  CALCIUM 7.6*   Liver Function Tests:  Recent Labs Lab 12/11/13 1110  AST 16  ALT 15  ALKPHOS 33*  BILITOT 0.4  PROT 4.9*  ALBUMIN 2.8*   CBC:  Recent Labs Lab 12/11/13 1110  WBC 5.3  HGB 9.6*  HCT 27.6*  MCV 91.7  PLT 106*   Cardiac Enzymes:  Recent Labs Lab 12/11/13 1110  TROPONINI <0.30   Radiographic Studies: No results found.  EKG: Unable to access.   Assessment/Plan:   Principal Problem:   Rectal bleed with acute blood loss anemia  GI consulted by EDP, Dr. Loreta AveMann will see the patient.  Keep nothing by mouth in anticipation of EGD.  Placed on high-dose PPI therapy.  Check serial hemoglobin levels and transfuse if needed.  Active Problems:   Hyperlipidemia  Continue statin therapy.    Thrombocytopenia  Unclear etiology. Follow trend. May need hematology consultation.    DVT prophylaxis  SCDs.  Code Status: Full. Family Communication: Bernardo Heaterileen Morales (wife), [phone]. Disposition Plan: Home when stable.  Time spent: 1 hour.  Empress Newmann [facility] Pager [phone] Cell: [phone]   If 7PM-7AM, please contact night-coverage [email or web address] Password [email or web address] 12/11/2013, 2:34 PM

## 2013-12-12 ENCOUNTER — Encounter (HOSPITAL_COMMUNITY)
Admission: EM | Disposition: A | Discharge status: Home / Self Care | Payer: Self-pay | Source: Home / Self Care | Attending: Internal Medicine

## 2013-12-12 ENCOUNTER — Encounter (HOSPITAL_COMMUNITY): Payer: Self-pay | Admitting: *Deleted

## 2013-12-12 HISTORY — PX: ESOPHAGOGASTRODUODENOSCOPY: SHX5428

## 2013-12-12 LAB — BASIC METABOLIC PANEL
Anion gap: 9 (ref 5–15)
BUN: 20 mg/dL (ref 6–23)
CO2: 25 mEq/L (ref 19–32)
Calcium: 8.1 mg/dL — ABNORMAL LOW (ref 8.4–10.5)
Chloride: 106 mEq/L (ref 96–112)
Creatinine, Ser: 0.99 mg/dL (ref 0.50–1.35)
GFR calc Af Amer: >90 mL/min (ref >90)
GFR, EST NON AFRICAN AMERICAN: 90 mL/min — AB (ref >90)
Glucose, Bld: 105 mg/dL — ABNORMAL HIGH (ref 70–99)
POTASSIUM: 3.7 meq/L (ref 3.7–5.3)
Sodium: 140 mEq/L (ref 137–147)

## 2013-12-12 LAB — CBC
HCT: 27.5 % — ABNORMAL LOW (ref 39.0–52.0)
Hemoglobin: 9.6 g/dL — ABNORMAL LOW (ref 13.0–17.0)
MCH: 32.1 pg (ref 26.0–34.0)
MCHC: 34.9 g/dL (ref 30.0–36.0)
MCV: 92 fL (ref 78.0–100.0)
PLATELETS: 130 10*3/uL — AB (ref 150–400)
RBC: 2.99 MIL/uL — ABNORMAL LOW (ref 4.22–5.81)
RDW: 12.6 % (ref 11.5–15.5)
WBC: 6.7 10*3/uL (ref 4.0–10.5)

## 2013-12-12 SURGERY — EGD (ESOPHAGOGASTRODUODENOSCOPY)
Anesthesia: Moderate Sedation | Laterality: Left

## 2013-12-12 MED ORDER — LIDOCAINE VISCOUS 2 % MT SOLN
OROMUCOSAL | Status: AC
  Filled 2013-12-12: qty 15

## 2013-12-12 MED ORDER — FENTANYL CITRATE 0.05 MG/ML IJ SOLN
INTRAMUSCULAR | Status: AC
  Filled 2013-12-12: qty 2

## 2013-12-12 MED ORDER — FENTANYL CITRATE 0.05 MG/ML IJ SOLN
INTRAMUSCULAR | Status: DC | PRN
  Administered 2013-12-12: 50 ug via INTRAVENOUS

## 2013-12-12 MED ORDER — MIDAZOLAM HCL 10 MG/2ML IJ SOLN
INTRAMUSCULAR | Status: AC
  Filled 2013-12-12: qty 2

## 2013-12-12 MED ORDER — SODIUM CHLORIDE 0.9 % IV SOLN: INTRAVENOUS | Status: DC

## 2013-12-12 MED ORDER — MIDAZOLAM HCL 10 MG/2ML IJ SOLN
INTRAMUSCULAR | Status: DC | PRN
  Administered 2013-12-12: 4 mg via INTRAVENOUS

## 2013-12-12 MED ORDER — LIDOCAINE VISCOUS 2 % MT SOLN
OROMUCOSAL | Status: DC | PRN
  Administered 2013-12-12: 20 mL via OROMUCOSAL

## 2013-12-12 NOTE — Plan of Care (Signed)
Problem: Phase III Progression Outcomes Goal: Voiding independently Outcome: Completed/Met Date Met:  12/12/13 Patient uses urinal to void,voiding adequate amount.

## 2013-12-12 NOTE — Plan of Care (Signed)
Problem: Phase I Progression Outcomes Goal: Pain controlled with appropriate interventions Outcome: Completed/Met Date Met:  12/12/13 Goal: OOB as tolerated unless otherwise ordered Outcome: Completed/Met Date Met:  12/12/13 Goal: Initial discharge plan identified Outcome: Completed/Met Date Met:  12/12/13

## 2013-12-12 NOTE — Op Note (Signed)
[facility] [address] [place], [id number]   OPERATIVE PROCEDURE REPORT  PATIENT :Brent Chapman, Brent Chapman  MR#: [id number] BIRTHDATE :[birth year] GENDER: male ENDOSCOPIST: Lorenza BurtonJyothi N Khaleel Beckom, MD ASSISTANT:   Kevin Fentonerri Moore, RN and Oletha Blendavida Shoffner, technician. PROCEDURE DATE: 12/12/2013 PRE-PROCEDURE PREPERATION: Patient fasted for 4 hours prior to procedure. PRE-PROCEDURE PHYSICAL: Patient has stable vital signs. Chest clear to auscultation. S1 and S2 regular. Abdomen soft, non-distended, non-tender with NABS. PROCEDURE:     EGD w/ biopsy ASA CLASS:     Class II INDICATIONS:     1) Hematochezia 2) Iron deficiency anemia. MEDICATIONS:     Fentanyl 75 mcg and Versed 4 mg IV TOPICAL ANESTHETIC:   Viscous Xylocaine-10 cc PO.  DESCRIPTION OF PROCEDURE:   After the risks benefits and alternatives of the procedure were thoroughly explained, informed consent was obtained.  The Pentax Gastroscope [id number]  was introduced through the mouth and advanced to the second portion of the duodenum , without limitations. The instrument was slowly withdrawn as the mucosa was fully examined.      ESOPHAGUS: Linear and circumferential folds in the esophagus-biopsied to rule out Eosinophilic Esophagitis; no definite stricture noted.  STOMACH: A small hiatal hernia was noted on retroflexion; the rest of the gastric mucosa appeared normal; antral biopsies done to rule out H. pylori by pathology.  DUODENUM: Two bleeding ulcers were found in the duodenal bulb-one was 6 mm in size and one was about 1 cm in size; there was friable duodenal bulb mucosda around the ulcers but there was on visible vessel noted. The small bowel distal to the bulb appeared normal.  a. . The scope was then withdrawn from the patient and the procedure terminated. The patient tolerated the procedure without immediate complications.  IMPRESSION:  1) Circular and linear folds in the esophageal mucosa without any  obvious stricture-biospied to rule out Eosinophilic Esophagitis. 2) Small hiatl hernia noted on retroflexion. 3) Antral biopsies done to rule out H. pylori by pathology. 4) Moderate duodenitis with 2 ulcers noted in the duodenal bulb without any visible vessel.  RECOMMENDATIONS:     1.  Await pathology results 2.  Avoid all NSAIDS for 4 weeks 3.  Anti-reflux regimen to be followed. 4.  Continue current medications. 5.  Continue PPI's. 6.  OK to discharge patient tomorrow on PPI's. 7.   OP follow-up in 2 weeks.  REPEAT EXAM:  no repeat exam necessary.  DISCHARGE INSTRUCTIONS: standard discharge instructions given. 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  The verification of the translation of this report to the ICD and CPT codes and modifiers is the sole responsibility of the health care institution and practicing physician where this report was generated.  [employer] will not be held responsible for the validity of the ICD and CPT codes included on this report.  AMA assumes no liability for data contained or not contained herein. CPT is a Publishing rights managerregistered trademark of the [employer].   CC: Johny BlamerWilliam Harris, M.D.  PATIENT NAME:  Brent Chapman, Brent Chapman MR#: [id number]

## 2013-12-12 NOTE — Progress Notes (Signed)
Patient arrived to floor from endo.,alert and orientedx4.Denies pain.Brent Chapman- Yoltzin Barg RN

## 2013-12-12 NOTE — OR Nursing (Signed)
Procedure room 4 monitor associated to patient but did not transfer data.  Patient VS remained stable throughout the procedure.  No adverse events to be noted.

## 2013-12-12 NOTE — Plan of Care (Signed)
Problem: Phase I Progression Outcomes Goal: Hemodynamically stable Outcome: Progressing  Problem: Phase II Progression Outcomes Goal: Progress activity as tolerated unless otherwise ordered Outcome: Completed/Met Date Met:  12/12/13 Goal: Discharge plan established Outcome: Completed/Met Date Met:  12/12/13 Goal: Vital signs remain stable Outcome: Completed/Met Date Met:  12/12/13 Goal: IV changed to normal saline lock Outcome: Completed/Met Date Met:  12/12/13 Goal: Obtain order to discontinue catheter if appropriate Outcome: Not Applicable Date Met:  96/92/49 Goal: Other Phase II Outcomes/Goals Outcome: Completed/Met Date Met:  12/12/13

## 2013-12-12 NOTE — Progress Notes (Addendum)
Patient ID: Brent CollardWilliam L Chapman, male   DOB: 1955/05/13, 58 y.o.   MRN: [id number] [facility] PROGRESS NOTE  Brent CollardWilliam L Chapman [id number] DOB: 1955/05/13 DOA: 12/11/2013 PCP: [name], Tadd, MD  Brief narrative: 58 y.o. male with a PMH of NSAID induced gastritis about 6 years ago and hyperlipidemia who presented to the ER 12/11/2013 with a chief complaint of bloody stools.Patient reported sudden onset explosive diarrhea, maroon color mixed some blood streaks. Patient also reported nausea and dry heaves. On admission, hemoglobin was 10.1. He was placed on PPI IV therapy.   Assessment/Plan:    Principal Problem: Upper and lower GI bleed / Acute blood loss anemia  Unclear etiology; perhaps due to NSAID's  Appreciate GI consult and recommendations  Keep NPO prior to procedure today.  Continue IV fluids, antiemetics as needed.  Continue Protonix 40 mg IV Q 12 hours.   Active Problems:  Hyperlipidemia  Continue statin therapy.   Thrombocytopenia  Unclear etiology. Platelet count trending up from 106 to 130.  Using SCD's for DVT prophylaxis   DVT prophylaxis  SCDs.  Code Status: Full.  Family Communication:  plan of care discussed with the patient and his wife at the bedside.  Disposition Plan: Home when stable.   IV access:   PeripheralIV  Procedures and diagnostic studies:    No results found.  Medical Consultants:   Dr. Ranae PalmsJothi Mann, Gastroenterology  Other Consultants:   None   IAnti-Infectives:    None    Manson PasseyEVINE, Kamren Heskett, MD  [facility] Pager [phone]  If 7PM-7AM, please contact night-coverage [email or web address] Password [email or web address] 12/12/2013, 4:21 PM   LOS: 1 day    HPI/Subjective: No acute overnight events.  Objective: Filed Vitals:   12/12/13 1600 12/12/13 1605 12/12/13 1610 12/12/13 1615  BP: 96/42  106/63   Pulse: 57 61 80 66  Temp:      TempSrc:      Resp: 18 11 20 10   Height:      Weight:      SpO2: 99% 99% 99% 99%     Intake/Output Summary (Last 24 hours) at 12/12/13 1621 Last data filed at 12/12/13 0944  Gross per 24 hour  Intake 1658.33 ml  Output   3200 ml  Net -1541.67 ml    Exam:   General:  Pt is alert, follows commands appropriately, not in acute distress  Cardiovascular: Regular rate and rhythm, S1/S2, no murmurs  Respiratory: Clear to auscultation bilaterally, no wheezing, no crackles, no rhonchi  Abdomen: Soft, non tender, non distended, bowel sounds present  Extremities: No edema, pulses DP and PT palpable bilaterally  Neuro: Grossly nonfocal  Data Reviewed: Basic Metabolic Panel:  Recent Labs Lab 12/11/13 1110 12/12/13 0151  NA 141 140  K 5.0 3.7  CL 109 106  CO2 25 25  GLUCOSE 99 105*  BUN 37* 20  CREATININE 0.99 0.99  CALCIUM 7.6* 8.1*   Liver Function Tests:  Recent Labs Lab 12/11/13 1110  AST 16  ALT 15  ALKPHOS 33*  BILITOT 0.4  PROT 4.9*  ALBUMIN 2.8*   No results for input(s): LIPASE, AMYLASE in the last 168 hours. No results for input(s): AMMONIA in the last 168 hours. CBC:  Recent Labs Lab 12/11/13 1110 12/11/13 1436 12/11/13 2013 12/12/13 0151  WBC 5.3  --   --  6.7  HGB 9.6* 10.1* 9.7* 9.6*  HCT 27.6* 29.5* 27.9* 27.5*  MCV 91.7  --   --  92.0  PLT 106*  --   --  130*   Cardiac Enzymes:  Recent Labs Lab 12/11/13 1110  TROPONINI <0.30   BNP: Invalid input(s): POCBNP CBG: No results for input(s): GLUCAP in the last 168 hours.  No results found for this or any previous visit (from the past 240 hour(s)).   Scheduled Meds: . atorvastatin  20 mg Oral q1800  . pantoprazole (PROTONIX) IV  40 mg Intravenous Q12H   Continuous Infusions: . sodium chloride 1,000 mL (12/12/13 1046)  . sodium chloride

## 2013-12-13 DIAGNOSIS — K449 Diaphragmatic hernia without obstruction or gangrene: Secondary | ICD-10-CM

## 2013-12-13 DIAGNOSIS — K269 Duodenal ulcer, unspecified as acute or chronic, without hemorrhage or perforation: Secondary | ICD-10-CM

## 2013-12-13 MED ORDER — PANTOPRAZOLE SODIUM 40 MG PO TBEC: 40.0000 mg | DELAYED_RELEASE_TABLET | Freq: Every day | ORAL | Status: DC

## 2013-12-13 NOTE — Discharge Summary (Signed)
Physician Discharge Summary  Brent Chapman ZOX:[id number] DOB: 1955/07/24 DOA: 12/11/2013  PCP: [name], MD  Admit date: 12/11/2013 Discharge date: 12/13/2013   Recommendations for Outpatient Follow-Up:   1. The patient will follow-up with [name] in 2 weeks. Pathology from biopsy results will need to be reviewed with the patient at that visit.   Discharge Diagnosis:   Principal Problem:    Rectal bleed secondary to duodenal ulcer disease with acute blood loss anemia Active Problems:    Hyperlipidemia    Acute blood loss anemia    Thrombocytopenia    Duodenal ulcer disease    Hiatal hernia   Discharge Condition: Improved.  Diet recommendation: Low sodium, heart healthy.    History of Present Illness:   58 y.o. male with a PMH of NSAID induced gastritis about 6 years ago and hyperlipidemia who presented to the ER 12/11/2013 with a chief complaint of bloody stools.Patient reported sudden onset explosive diarrhea, maroon color mixed some blood streaks. Patient also reported nausea and dry heaves. On admission, hemoglobin was 10.1. He was placed on PPI IV therapy.   Hospital Course by Problem:   Principal Problem: Upper and lower GI bleed secondary to duodenal ulcer disease/ Acute blood loss anemia  Unclear etiology; perhaps due to NSAID's  Status post upper endoscopy 12/12/13 with findings as noted below. Duodenal ulcer disease.  Discharge home on PPI therapy.  Patient instructed to avoid NSAID use for at least 4 weeks.  Follow-up H pylori /biopsy results.  Hemoglobin stable throughout hospital stay, no need for blood transfusion.  Active Problems:   Hiatal hernia / duodenitis  Discharged home on PPI therapy.   Hyperlipidemia  Continue statin therapy.   Thrombocytopenia  Unclear etiology. Platelet count trending up from 106 to 130.    Medical Consultants:    Dr. Charna Elizabeth, Gastroenterology.   Discharge Exam:   Filed Vitals:     12/13/13 0518  BP: 97/59  Pulse: 60  Temp: 98 F (36.7 C)  Resp: 14   Filed Vitals:   12/12/13 1620 12/12/13 1749 12/12/13 2114 12/13/13 0518  BP: 109/65 100/65 106/56 97/59  Pulse: 61 64 60 60  Temp:  98.2 F (36.8 C) 98.1 F (36.7 C) 98 F (36.7 C)  TempSrc:  Oral Oral Oral  Resp: 13 18 14 14   Height:      Weight:      SpO2: 100% 100% 100% 100%    Gen:  NAD Cardiovascular:  RRR, No M/R/G Respiratory: Lungs CTAB Gastrointestinal: Abdomen soft, NT/ND with normal active bowel sounds. Extremities: No C/E/C   The results of significant diagnostics from this hospitalization (including imaging, microbiology, ancillary and laboratory) are listed below for reference.     Procedures and Diagnostic Studies:   EGD 12/12/13:  Small hiatal hernia. Antral biopsies done to rule out H. Pylori by pathology. Moderate duodenitis with 2 ulcers noted in the white no bulb without any visible vessel.   Labs:   Basic Metabolic Panel:  Recent Labs Lab 12/11/13 1110 12/12/13 0151  NA 141 140  K 5.0 3.7  CL 109 106  CO2 25 25  GLUCOSE 99 105*  BUN 37* 20  CREATININE 0.99 0.99  CALCIUM 7.6* 8.1*   GFR Estimated Creatinine Clearance: 96.9 mL/min (by C-G formula based on Cr of 0.99). Liver Function Tests:  Recent Labs Lab 12/11/13 1110  AST 16  ALT 15  ALKPHOS 33*  BILITOT 0.4  PROT 4.9*  ALBUMIN 2.8*  CBC:  Recent Labs Lab 12/11/13 1110 12/11/13 1436 12/11/13 2013 12/12/13 0151  WBC 5.3  --   --  6.7  HGB 9.6* 10.1* 9.7* 9.6*  HCT 27.6* 29.5* 27.9* 27.5*  MCV 91.7  --   --  92.0  PLT 106*  --   --  130*   Cardiac Enzymes:  Recent Labs Lab 12/11/13 1110  TROPONINI <0.30     Discharge Instructions:   Discharge Instructions    Activity as tolerated - No restrictions    Complete by:  As directed      Call MD for:  persistant nausea and vomiting    Complete by:  As directed      Call MD for:  severe uncontrolled pain    Complete by:  As directed       Call MD for:    Complete by:  As directed   Black or bloody stools.     Diet - low sodium heart healthy    Complete by:  As directed   Bland foods, avoid greasy/spicy foods.     Discharge instructions    Complete by:  As directed   Avoid any OTC pain relievers except for Tylenol.   You were cared for by Dr. [name]  (a hospitalist) during your hospital stay. If you have any questions about your discharge medications or the care you received while you were in the hospital after you are discharged, you can call the unit and ask to speak with the hospitalist on call if the hospitalist that took care of you is not available. Once you are discharged, your primary care physician will handle any further medical issues. Please note that NO REFILLS for any discharge medications will be authorized once you are discharged, as it is imperative that you return to your primary care physician (or establish a relationship with a primary care physician if you do not have one) for your aftercare needs so that they can reassess your need for medications and monitor your lab values.  Any outstanding tests can be reviewed by your PCP at your follow up visit.  It is also important to review any medicine changes with your PCP.  Please bring these d/c instructions with you to your next visit so your physician can review these changes with you.            Medication List    STOP taking these medications        TURMERIC PO      TAKE these medications        atorvastatin 20 MG tablet  Commonly known as:  LIPITOR  20 mg daily.     pantoprazole 40 MG tablet  Commonly known as:  PROTONIX  Take 1 tablet (40 mg total) by mouth daily.     sildenafil 25 MG tablet  Commonly known as:  VIAGRA  Take 100 mg by mouth daily as needed for erectile dysfunction.           Follow-up Information    Follow up with [name], [name], MD.   Specialty:  Family Medicine   Why:  As needed   Contact  information:   [address] [place] [address] [phone]       Follow up with MANN,[name], MD. Schedule an appointment as soon as possible for a visit in 2 weeks.   Specialty:  Gastroenterology   Contact information:   [address], [address] [place] [address]  [phone]        Time coordinating discharge: 35 minutes.  Signed:  RAMA,CHRISTINA  Pager [phone] [facility] 12/13/2013, 1:02 PM

## 2013-12-13 NOTE — Progress Notes (Signed)
Patient ID: Brent CollardWilliam L Chapman, male   DOB: 10/20/1955, 58 y.o.   MRN: [id number] Patient discharged home.  Discharge medications and instructions reviewed and all questions answered.  Patient packed all belongings with daughter present.  Refused wheelchair for discharge.  Ambulated off of floor with daughter.    Kelli HopeBarber, Elpidio Thielen M

## 2013-12-13 NOTE — Discharge Instructions (Signed)

## 2013-12-15 ENCOUNTER — Encounter (HOSPITAL_COMMUNITY): Payer: Self-pay | Admitting: Gastroenterology

## 2014-09-14 ENCOUNTER — Ambulatory Visit (INDEPENDENT_AMBULATORY_CARE_PROVIDER_SITE_OTHER): Payer: BC Managed Care – PPO | Admitting: Cardiology

## 2014-09-14 ENCOUNTER — Encounter: Payer: Self-pay | Admitting: Cardiology

## 2014-09-14 VITALS — BP 110/60 | HR 73 | Ht 74.0 in | Wt 194.0 lb

## 2014-09-14 DIAGNOSIS — E785 Hyperlipidemia, unspecified: Secondary | ICD-10-CM

## 2014-09-14 DIAGNOSIS — Z8249 Family history of ischemic heart disease and other diseases of the circulatory system: Secondary | ICD-10-CM | POA: Insufficient documentation

## 2014-09-14 NOTE — Progress Notes (Signed)
Cardiology Office Note   Date:  09/14/2014   ID:  Brent Chapman, DOB October 15, 1955, MRN [id number]  PCP:  [name], MD    Chief Complaint  Patient presents with  . New Evaluation    hyperlipidemia      History of Present Illness: Brent Chapman is a 59 y.o. male who presents for evaluation of cardiac risk.  He has a history of dyslipidemia and his last LDL was 84.   He is  concerned because he has a family history of vascular disease with his father having an AAA.  His father is a smoker.  He is concerned about his risk of heart disease.  He does a lot of aerobic exercise without any problems.  There is no family history of heart disease.  He had a stress test years ago that was fine.  He denies any chest pain, SOB, DOE, LE edema, dizziness, palpitations or syncope.  He is on a statin which he tolerates well and watches his diet. He runs 2-3 miles a few days a week and bikes for about 30 minutes up to 15 miles.   He is on Atorvastatin 20mg  daily.      Past Medical History  Diagnosis Date  . Hyperlipidemia   . Gastritis     NSAID induced  . Rotator cuff syndrome of left shoulder 01/10/2011    Classic impingement findings on exam     Past Surgical History  Procedure Laterality Date  . Esophagogastroduodenoscopy Left 12/12/2013    Procedure: ESOPHAGOGASTRODUODENOSCOPY (EGD);  Surgeon: Charna Elizabeth, MD;  Location: [facility];  Service: Endoscopy;  Laterality: Left;     Current Outpatient Prescriptions  Medication Sig Dispense Refill  . atorvastatin (LIPITOR) 20 MG tablet 20 mg daily.     . sildenafil (VIAGRA) 25 MG tablet Take 100 mg by mouth daily as needed for erectile dysfunction.    . pantoprazole (PROTONIX) 40 MG tablet Take 1 tablet (40 mg total) by mouth daily. (Patient not taking: Reported on 09/14/2014) 60 tablet 2   No current facility-administered medications for this visit.    Allergies:   Review of patient's allergies indicates no  known allergies.    Social History:  The patient  reports that he has never smoked. He has never used smokeless tobacco. He reports that he drinks alcohol. He reports that he does not use illicit drugs.   Family History:  The patient's family history includes Cancer in his father. There is no history of Sudden death, Hypertension, Hyperlipidemia, Heart attack, or Diabetes.    ROS:  Please see the history of present illness.   Otherwise, review of systems are positive for none.   All other systems are reviewed and negative.    PHYSICAL EXAM: VS:  BP 110/60 mmHg  Pulse 73  Ht 6\' 2"  (1.88 m)  Wt 194 lb (87.998 kg)  BMI 24.90 kg/m2  SpO2 98% , BMI Body mass index is 24.9 kg/(m^2). GEN: Well nourished, well developed, in no acute distress HEENT: normal Neck: no JVD, carotid bruits, or masses Cardiac: RRR; no murmurs, rubs, or gallops,no edema  Respiratory:  clear to auscultation bilaterally, normal work of breathing GI: soft, nontender, nondistended, + BS MS: no deformity or atrophy Skin: warm and dry, no rash Neuro:  Strength and sensation are intact Psych: euthymic mood, full affect   EKG:  EKG was ordered today. The ekg  ordered today demonstrates sinus bradycardia at 59bpm with nonspecific ST abnormality   Recent Labs: 12/11/2013: ALT 15 12/12/2013: BUN 20; Creatinine, Ser 0.99; Hemoglobin 9.6*; Platelets 130*; Potassium 3.7; Sodium 140    Lipid Panel No results found for: CHOL, TRIG, HDL, CHOLHDL, VLDL, LDLCALC, LDLDIRECT    Wt Readings from Last 3 Encounters:  09/14/14 194 lb (87.998 kg)  12/11/13 189 lb 6.4 oz (85.911 kg)  11/13/12 200 lb (90.719 kg)       ASSESSMENT AND PLAN:  1.  Family history of vascular disease - his father had an AAA and dyslipidemi but also was a smoker.  He has no CP or SOB and exercises daily.  I will get a Chest CT calcium score to assess cardiac risk.  I will also get an ETT> 2.  Dyslipidemia on statin.  LDL at goal in 80's but if  calcium score is high will need to be more aggressive with treatment.   Current medicines are reviewed at length with the patient today.  The patient does not have concerns regarding medicines.  The following changes have been made:  no change  Labs/ tests ordered today: See above Assessment and Plan No orders of the defined types were placed in this encounter.     Disposition:   FU with me PRN pending results of studies  SignedQuintella Reichert, MD  09/14/2014 11:54 AM    [facility] [address] [place], [place], [place]  [address] Phone: [phone]; Fax: [phone]

## 2014-09-14 NOTE — Patient Instructions (Addendum)
Medication Instructions:  Your physician recommends that you continue on your current medications as directed. Please refer to the Current Medication list given to you today.   Labwork: None  Testing/Procedures: Dr. Mayford Knife recommends you have a CALCIUM SCORE.  Your physician has requested that you have an exercise tolerance test. For further information please visit [email or web address] Please also follow instruction sheet, as given.  Follow-Up: Your physician recommends that you schedule a follow-up appointment AS NEEDED with Dr. Mayford Knife pending your study results.  Any Other Special Instructions Will Be Listed Below (If Applicable).

## 2014-09-23 ENCOUNTER — Ambulatory Visit (INDEPENDENT_AMBULATORY_CARE_PROVIDER_SITE_OTHER)
Admission: RE | Admit: 2014-09-23 | Discharge: 2014-09-23 | Disposition: A | Discharge status: Home / Self Care | Payer: Self-pay | Source: Ambulatory Visit | Attending: Cardiology | Admitting: Cardiology

## 2014-09-23 DIAGNOSIS — Z8249 Family history of ischemic heart disease and other diseases of the circulatory system: Secondary | ICD-10-CM

## 2014-09-23 DIAGNOSIS — E785 Hyperlipidemia, unspecified: Secondary | ICD-10-CM

## 2014-09-24 ENCOUNTER — Telehealth: Payer: Self-pay | Admitting: *Deleted

## 2014-09-24 NOTE — Telephone Encounter (Signed)
Pt notified of CT Scoring 0; pt aware of finding on CT of small pulmonary nodule and to f/u w/PCP for further eval. I will fax results to PCP Dr. Holley Bouche. Pt verbalized understanding by phone to results given.

## 2014-10-27 ENCOUNTER — Encounter: Payer: BC Managed Care – PPO | Admitting: Nurse Practitioner

## 2014-11-11 ENCOUNTER — Encounter: Payer: BC Managed Care – PPO | Admitting: Nurse Practitioner

## 2015-03-22 ENCOUNTER — Telehealth: Payer: Self-pay | Admitting: Cardiology

## 2015-03-22 NOTE — Telephone Encounter (Signed)
New message   Please call pt he has questions about test and orders for scheduling

## 2015-03-22 NOTE — Telephone Encounter (Signed)
Patient called to see what kind of scan he got last summer where an incidental lung nodule was found.  Informed patient he had a CT for calcium score. Patient grateful for call and another copy of results sent to PCP per patient request.

## 2015-03-23 ENCOUNTER — Other Ambulatory Visit: Payer: Self-pay | Admitting: Family Medicine

## 2015-03-23 DIAGNOSIS — R911 Solitary pulmonary nodule: Secondary | ICD-10-CM

## 2015-03-26 ENCOUNTER — Ambulatory Visit
Admission: RE | Admit: 2015-03-26 | Discharge: 2015-03-26 | Disposition: A | Discharge status: Home / Self Care | Payer: BC Managed Care – PPO | Source: Ambulatory Visit | Attending: Family Medicine | Admitting: Family Medicine

## 2015-03-26 DIAGNOSIS — R911 Solitary pulmonary nodule: Secondary | ICD-10-CM

## 2015-03-26 MED ORDER — IOPAMIDOL (ISOVUE-300) INJECTION 61%
75.0000 mL | Freq: Once | INTRAVENOUS | Status: AC | PRN
  Administered 2015-03-26: 75 mL via INTRAVENOUS

## 2016-04-04 ENCOUNTER — Other Ambulatory Visit: Payer: Self-pay | Admitting: Family Medicine

## 2016-04-04 DIAGNOSIS — R911 Solitary pulmonary nodule: Secondary | ICD-10-CM

## 2016-04-19 ENCOUNTER — Inpatient Hospital Stay
Admission: RE | Admit: 2016-04-19 | Discharge: 2016-04-19 | Disposition: A | Discharge status: Home / Self Care | Payer: BC Managed Care – PPO | Source: Ambulatory Visit | Attending: Family Medicine | Admitting: Family Medicine

## 2016-05-12 IMAGING — CT CT HEART SCORING
1 of 3 series · 10 of 20 positions shown, 13 images · non-contrast
Comparison: No priors.

CLINICAL DATA: Risk stratification

EXAM:
Coronary Calcium Score
TECHNIQUE: The patient was scanned on a Siemens Sensation 16 slice scanner.
Axial non-contrast 3 mm slices were carried out through the heart.
The data set was analyzed on a dedicated work station and scored
using the Agatson method.

[Series 6: st thins for reformat · axial · 0.71mm/px · z∈[-277,-148]mm · 10 of 159 slices shown, 13 images]
[im 15/159  vessel]
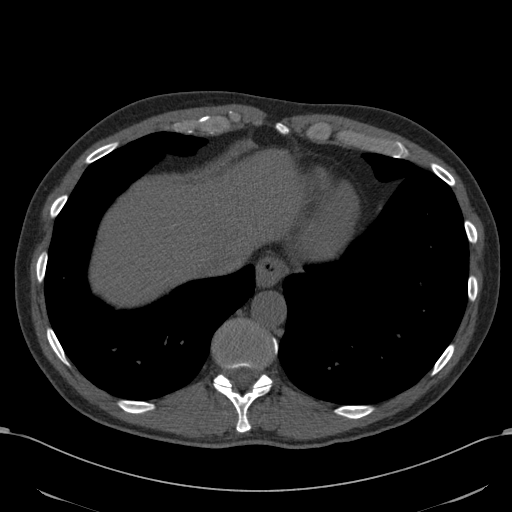
[im 15/159  lung]
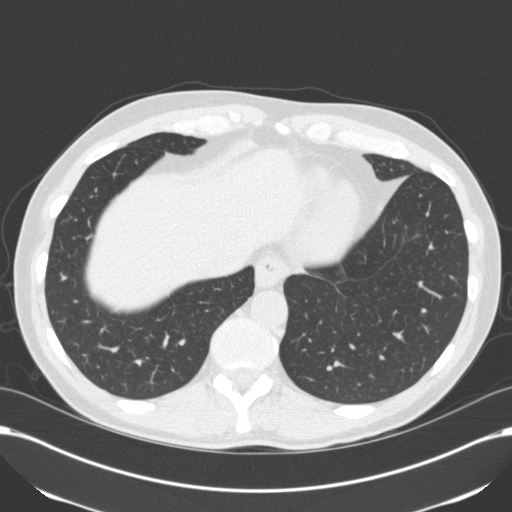
[im 29/159  vessel]
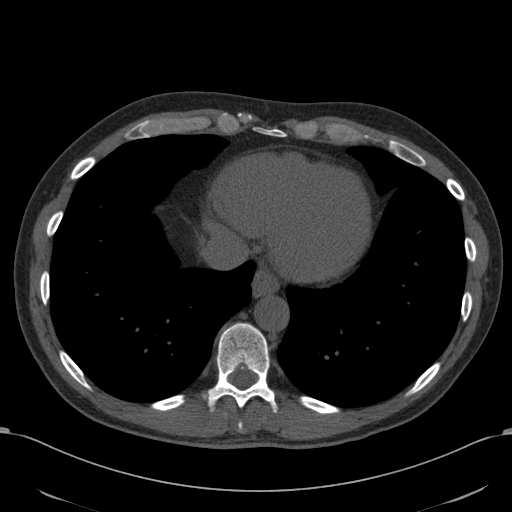
[im 44/159  vessel]
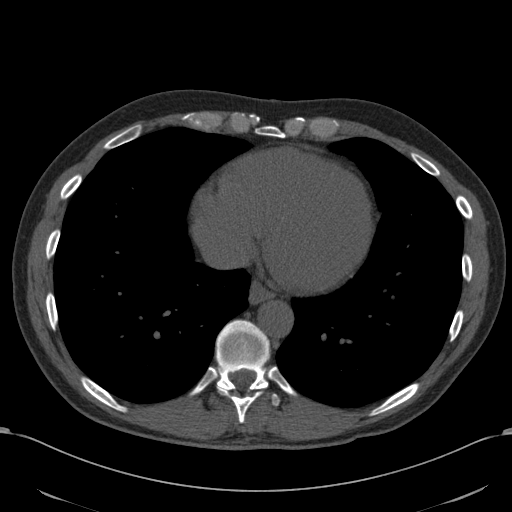
[im 58/159  vessel]
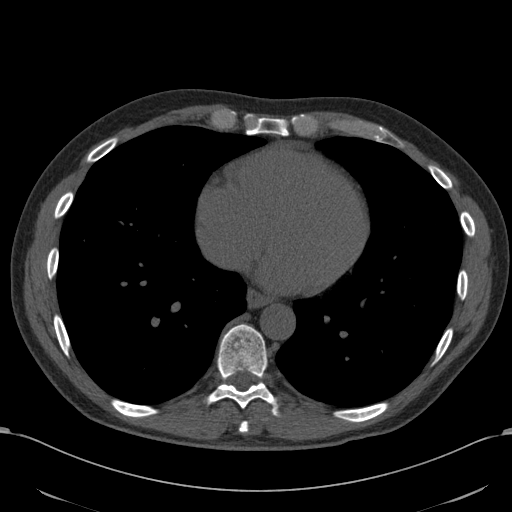
[im 72/159  vessel]
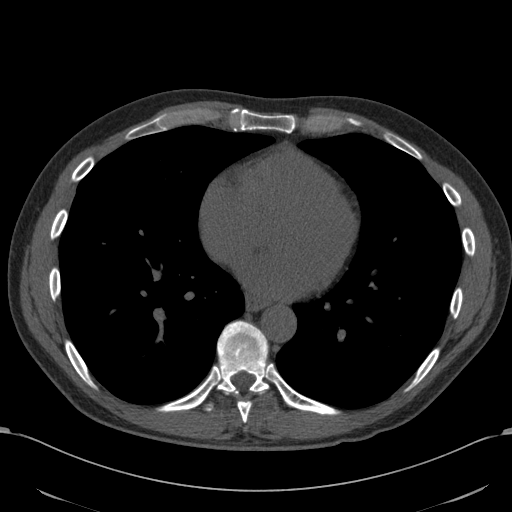
[im 72/159  lung]
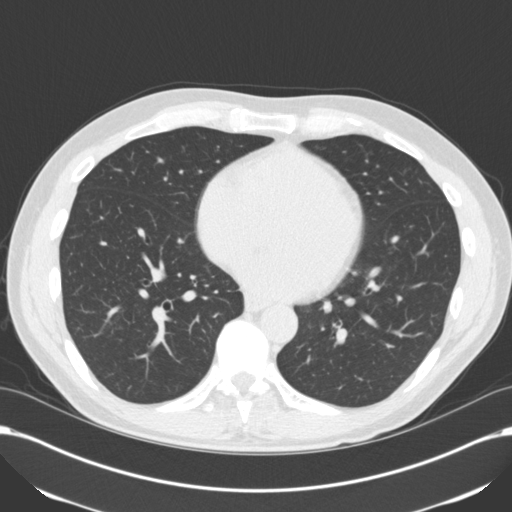
[im 87/159  vessel]
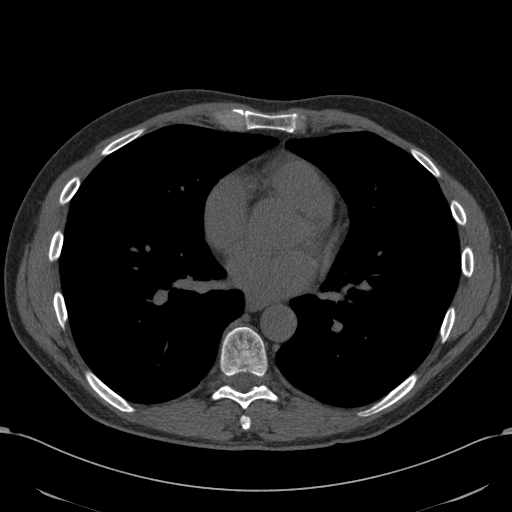
[im 101/159  vessel]
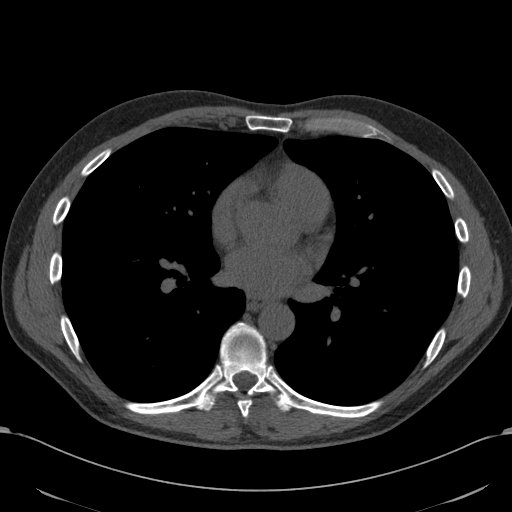
[im 115/159  vessel]
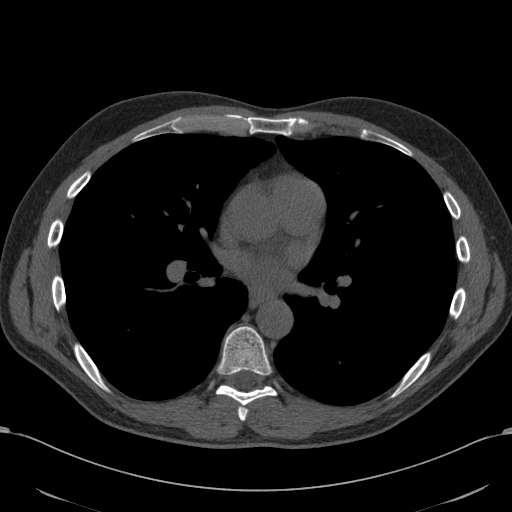
[im 130/159  vessel]
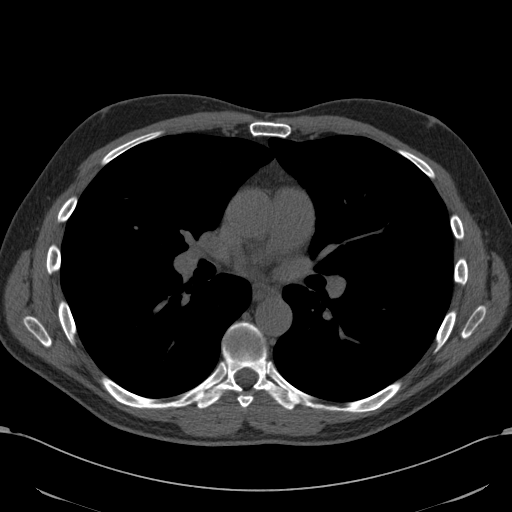
[im 130/159  lung]
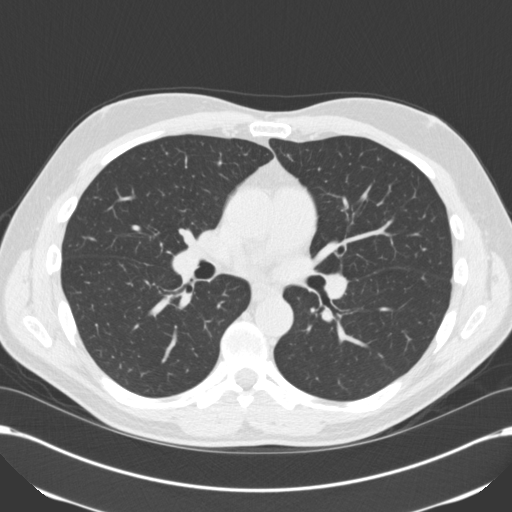
[im 144/159  vessel]
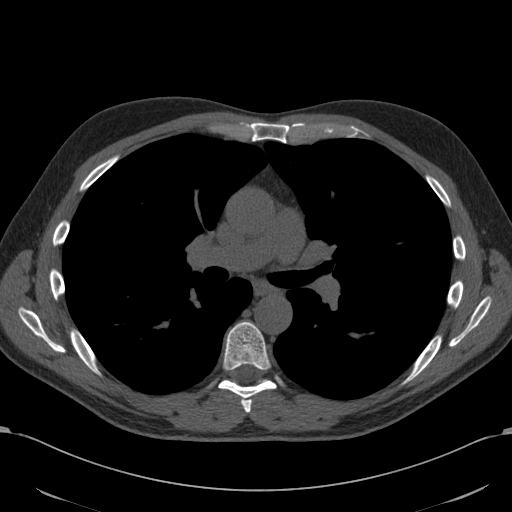

[10 of 20 positions shown; findings below may reference images not displayed]

FINDINGS: Non-cardiac: See separate report from [REDACTED].

Ascending Aorta:  Normal caliber.

Pericardium: Normal

Coronary arteries:  Normal origin.
IMPRESSION: Coronary calcium score of 0. This was 0 percentile for age and sex
matched control.

Gugger Miche

EXAM:
OVER-READ INTERPRETATION  CT CHEST

The following report is an over-read performed by radiologist Dr.
over-read does not include interpretation of cardiac or coronary
anatomy or pathology. The coronary calcium score interpretation by
the cardiologist is attached.
FINDINGS: 4 mm right lower lobe pulmonary nodule (image 23 of series 4).
Within the visualized portions of the thorax there are no larger
more suspicious appearing pulmonary nodules or masses, there is no
acute consolidative airspace disease, no pleural effusion, no
pneumothorax and no lymphadenopathy. Visualized portions of the
upper abdomen are unremarkable. There are no aggressive appearing
lytic or blastic lesions noted in the visualized portions of the
skeleton.
IMPRESSION: 1. 4 mm right lower lobe pulmonary nodule. If the patient is at high
risk for bronchogenic carcinoma, follow-up chest CT at 1 year is
recommended. If the patient is at low risk, no follow-up is needed.
This recommendation follows the consensus statement: Guidelines for
Management of Small Pulmonary Nodules Detected on CT Scans: A
Statement from the [HOSPITAL] as published in Radiology

## 2016-05-22 ENCOUNTER — Encounter: Payer: Self-pay | Admitting: Family Medicine

## 2016-05-22 ENCOUNTER — Encounter (INDEPENDENT_AMBULATORY_CARE_PROVIDER_SITE_OTHER): Payer: Self-pay

## 2016-05-22 ENCOUNTER — Ambulatory Visit (INDEPENDENT_AMBULATORY_CARE_PROVIDER_SITE_OTHER): Payer: BC Managed Care – PPO | Admitting: Family Medicine

## 2016-05-22 DIAGNOSIS — M7662 Achilles tendinitis, left leg: Secondary | ICD-10-CM

## 2016-05-22 MED ORDER — NITROGLYCERIN 0.2 MG/HR TD PT24: MEDICATED_PATCH | TRANSDERMAL | 1 refills | Status: DC

## 2016-05-22 NOTE — Patient Instructions (Addendum)
You have Achilles Tendinopathy Tylenol or motrin if needed for pain. Continue physical therapy. Calf raises 3 sets of 10 on level ground once a day first. When these are easy, can do them one legged 3 sets of 10. Finally advance to doing them on a step. Ice bucket 10-15 minutes at end of day - can ice 3-4 times a day. Avoid uneven ground, hills, flat shoes, barefoot walking as much as possible. Heel lifts in shoes or shoes with a natural heel lift. Nitro patches 1/4th patch over area, change daily.  There MUST be 48 hours from when you take this off and take viagra.  Similarly must wait 48 hours after taking viagra to restart the nitro patches. Follow up in 6 weeks.

## 2016-05-23 DIAGNOSIS — M7662 Achilles tendinitis, left leg: Secondary | ICD-10-CM | POA: Insufficient documentation

## 2016-05-23 NOTE — Progress Notes (Signed)
PCP: [name], MD  Subjective:   HPI: Patient is a 61 y.o. male here for left ankle pain.  Patient reports he started to get some pain back in February posterior heel. No acute injury or trauma. Is a runner - training for a triathlon this summer. Worse when going uphill. Has [facility] 4 [facility] of [facility]. Feels a burning pain here after jogging. No problems with swimming or biking. Pain level 2/10. No skin changes, numbness but has localized swelling posterior heel.  Past Medical History:  Diagnosis Date  . Gastritis    NSAID induced  . Hyperlipidemia   . Rotator cuff syndrome of left shoulder 01/10/2011   Classic impingement findings on exam     Current Outpatient Prescriptions on File Prior to Visit  Medication Sig Dispense Refill  . atorvastatin (LIPITOR) 20 MG tablet 20 mg daily.     . pantoprazole (PROTONIX) 40 MG tablet Take 1 tablet (40 mg total) by mouth daily. (Patient not taking: Reported on 09/14/2014) 60 tablet 2  . sildenafil (VIAGRA) 25 MG tablet Take 100 mg by mouth daily as needed for erectile dysfunction.     No current facility-administered medications on file prior to visit.     Past Surgical History:  Procedure Laterality Date  . ESOPHAGOGASTRODUODENOSCOPY Left 12/12/2013   Procedure: ESOPHAGOGASTRODUODENOSCOPY (EGD);  Surgeon: Charna Elizabeth, MD;  Location: [facility];  Service: Endoscopy;  Laterality: Left;    No Known Allergies  Social History   Social History  . Marital status: Married    Spouse name: Marjean Donna  . Number of children: 2  . Years of education: N/A   Occupational History  . Elementary school teacher    Social History Main Topics  . Smoking status: Never Smoker  . Smokeless tobacco: Never Used  . Alcohol use 0.0 oz/week     Comment: 2 beers a week.  . Drug use: No  . Sexual activity: Not Currently   Other Topics Concern  . Not on file   Social History Narrative   Married.  Independent of ADLs.     Family History  Problem Relation Age of Onset  . Sudden death Neg Hx   . Hypertension Neg Hx   . Hyperlipidemia Neg Hx   . Heart attack Neg Hx   . Diabetes Neg Hx   . Cancer Father     Prostate    BP 122/80   Pulse (!) 58   Ht  (1.88 m)   Wt 190 lb (86.2 kg)   BMI 24.39 kg/m   Review of Systems: See HPI above.     Objective:  Physical Exam:  Gen: NAD, comfortable in exam room  Left ankle: Small haglund deformity.  No other deformity, swelling, bruising. FROM with mild pain on full dorsiflexion and with calf raises. TTP achilles at insertion on calcaneus.  No other tenderness. Negative ant drawer and talar tilt.   Negative syndesmotic compression. Negative calcaneal squeeze. Thompsons test negative. NV intact distally.  Right ankle: FROM without pain.   Assessment & Plan:  1. Left achilles tendinopathy - shown home exercises to do daily and continue physical therapy.  Icing.  Heel lifts.  Tylenol or motrin if needed.  Start nitro patches - stressed importance of 48 hours between nitro and viagra.  F/u in 6 weeks.

## 2016-05-23 NOTE — Assessment & Plan Note (Signed)
shown home exercises to do daily and continue physical therapy.  Icing.  Heel lifts.  Tylenol or motrin if needed.  Start nitro patches - stressed importance of 48 hours between nitro and viagra.  F/u in 6 weeks.

## 2016-08-19 ENCOUNTER — Other Ambulatory Visit: Payer: Self-pay | Admitting: Family Medicine

## 2019-06-10 ENCOUNTER — Other Ambulatory Visit: Payer: Self-pay

## 2019-06-10 ENCOUNTER — Ambulatory Visit: Payer: Self-pay

## 2019-06-10 ENCOUNTER — Ambulatory Visit: Payer: BC Managed Care – PPO | Admitting: Sports Medicine

## 2019-06-10 ENCOUNTER — Encounter: Payer: Self-pay | Admitting: Sports Medicine

## 2019-06-10 VITALS — BP 114/72 | Ht 74.0 in | Wt 195.0 lb

## 2019-06-10 DIAGNOSIS — M25561 Pain in right knee: Secondary | ICD-10-CM | POA: Diagnosis not present

## 2019-06-10 DIAGNOSIS — M84361A Stress fracture, right tibia, initial encounter for fracture: Secondary | ICD-10-CM | POA: Diagnosis not present

## 2019-06-10 NOTE — Patient Instructions (Signed)
You have a stress reaction in your right medial tibia Start 500 mg of vitamin C twice a day Apply ice to fracture site 2-3 times daily for 10 to 20 minutes; may repeat if swelling increases and after training sessions Calcium 1500 mg and vitamin D 800 IU supplementation daily throughout rehabilitation 45 minutes stationary cycle daily for 3 weeks, swimming is also okay Heel raises, toe raises, and squats with weight every other day: 25 to 30 percent of body weight is used for three sets of 15 repetitions for each exercise  After 3 weeks can start running at 50% of your normal mileage as long as you are not limping and have no worse than 3/10 pain After another 2 weeks can increase this to 75% In between here would be a good time to follow-up After another 2 weeks can increase back to 100%

## 2019-06-10 NOTE — Progress Notes (Signed)
   [facility] [address] [place], [place] [address] Phone: [phone] Fax: [phone]   Patient Name: Brent Chapman Date of Birth: 03-10-1955 Medical Record Number: [id number] Gender: male Date of Encounter: 06/10/2019  SUBJECTIVE:      Chief Complaint:  Right leg pain   HPI:  Brent Chapman is a 64 year old gentleman presenting with about 3 weeks of right upper medial leg pain.  He is an avid runner, having run 3-5 miles every other day since high school.  Unfortunately from November 2020-April 2021 he was not able to run due to medical disease.  He started running at the beginning of April, slowly and gradually increasing as his mileage.  He did [place] 12 mile [place] 1 [place].  1 week after this event, he noticed pain with mild swelling at the upper right medial lower leg.  He has not run in the last week.  He is having pain 50% of the time, even now with walking.  He also recently had a tick bite in the same area he is having pain.     ROS:     See HPI. No significant swelling No night pain Mild pain only on bike   PERTINENT  PMH / PSH / FH / SH:  Past Medical, Surgical, Social, and Family History Reviewed & Updated in the EMR. Pertinent findings include:  History of stress fracture in high school, Achilles tendinitis, peptic ulcer disease cannot tolerate NSAIDs   OBJECTIVE:  BP 114/72   Ht 6\' 2"  (1.88 m)   Wt 195 lb (88.5 kg)   BMI 25.04 kg/m  Physical Exam:  Vital signs are reviewed.   GEN: Alert and oriented, NAD Pulm: Breathing unlabored PSY: normal mood, congruent affect  MSK: Right leg No erythema Mild TTP at proximal medial tibial shaft about 1.5 inches below medial joint line Full strength and ROM of ankle and knee Positive compression test Positive hop test Neurovascularly intact distally  Limited MSK ultrasound Right tibia The proximal right tibia was visualized in long and short axis demonstrating  periosteal inflammation noted by hypoechoic change and increased doppler flow along the medial proximal tibial shaft. There is slight cortical irregularity  Impression: Stress reaction to proximal medial tibia  Ultrasound was performed and interpreted by myself and Dr. B.  Fields, MD  ASSESSMENT & PLAN:   1. Right tibial stress reaction  We will start patient on vitamin C, vitamin D, and calcium.  Recommended for the next 3 weeks he focus on cycling and swimming. He should be icing daily.  We went over the exercises and gradual progression over the coming weeks that is laid out in the patient instructions.  He will follow-up with [facility] in the next 4 to 6 weeks for reevaluation.   [place], DO, ATC Sports Medicine Fellow I observed and examined the patient with Dr. Judge Stall and agree with assessment and plan.  Note reviewed and modified by me. Ruta Hinds, MD

## 2019-08-23 ENCOUNTER — Encounter (HOSPITAL_COMMUNITY): Payer: Self-pay | Admitting: Orthopedic Surgery

## 2019-08-23 ENCOUNTER — Other Ambulatory Visit (HOSPITAL_COMMUNITY)
Admission: RE | Admit: 2019-08-23 | Discharge: 2019-08-23 | Disposition: A | Discharge status: Home / Self Care | Payer: BC Managed Care – PPO | Source: Ambulatory Visit | Attending: Orthopedic Surgery | Admitting: Orthopedic Surgery

## 2019-08-23 DIAGNOSIS — Z20822 Contact with and (suspected) exposure to covid-19: Secondary | ICD-10-CM | POA: Insufficient documentation

## 2019-08-23 LAB — SARS CORONAVIRUS 2 (TAT 6-24 HRS): SARS Coronavirus 2: NEGATIVE

## 2019-08-23 NOTE — Progress Notes (Signed)
Patient denies chest pain, shortness of breath, or cardiac test. Educated on visitation policy. Patient reports he will remain in quarantine.

## 2019-08-24 ENCOUNTER — Encounter (HOSPITAL_COMMUNITY): Payer: Self-pay | Admitting: Orthopedic Surgery

## 2019-08-24 ENCOUNTER — Inpatient Hospital Stay (HOSPITAL_COMMUNITY): Payer: BC Managed Care – PPO | Admitting: Certified Registered Nurse Anesthetist

## 2019-08-24 ENCOUNTER — Encounter (HOSPITAL_COMMUNITY)
Admission: RE | Disposition: A | Discharge status: Home / Self Care | Payer: Self-pay | Source: Ambulatory Visit | Attending: Orthopedic Surgery

## 2019-08-24 ENCOUNTER — Ambulatory Visit (HOSPITAL_COMMUNITY)
Admission: RE | Admit: 2019-08-24 | Discharge: 2019-08-24 | Disposition: A | Discharge status: Home / Self Care | Payer: BC Managed Care – PPO | Source: Ambulatory Visit | Attending: Orthopedic Surgery | Admitting: Orthopedic Surgery

## 2019-08-24 ENCOUNTER — Inpatient Hospital Stay (HOSPITAL_COMMUNITY): Payer: BC Managed Care – PPO

## 2019-08-24 ENCOUNTER — Other Ambulatory Visit: Payer: Self-pay

## 2019-08-24 DIAGNOSIS — T39395A Adverse effect of other nonsteroidal anti-inflammatory drugs [NSAID], initial encounter: Secondary | ICD-10-CM | POA: Insufficient documentation

## 2019-08-24 DIAGNOSIS — K219 Gastro-esophageal reflux disease without esophagitis: Secondary | ICD-10-CM | POA: Diagnosis not present

## 2019-08-24 DIAGNOSIS — Y9355 Activity, bike riding: Secondary | ICD-10-CM | POA: Diagnosis not present

## 2019-08-24 DIAGNOSIS — E785 Hyperlipidemia, unspecified: Secondary | ICD-10-CM | POA: Insufficient documentation

## 2019-08-24 DIAGNOSIS — S52022A Displaced fracture of olecranon process without intraarticular extension of left ulna, initial encounter for closed fracture: Secondary | ICD-10-CM | POA: Diagnosis not present

## 2019-08-24 DIAGNOSIS — S42402A Unspecified fracture of lower end of left humerus, initial encounter for closed fracture: Secondary | ICD-10-CM | POA: Diagnosis present

## 2019-08-24 DIAGNOSIS — Z79899 Other long term (current) drug therapy: Secondary | ICD-10-CM | POA: Diagnosis not present

## 2019-08-24 DIAGNOSIS — K2970 Gastritis, unspecified, without bleeding: Secondary | ICD-10-CM | POA: Insufficient documentation

## 2019-08-24 HISTORY — PX: ORIF ELBOW FRACTURE: SHX5031

## 2019-08-24 HISTORY — DX: Gastric ulcer, unspecified as acute or chronic, without hemorrhage or perforation: K25.9

## 2019-08-24 HISTORY — DX: Gastro-esophageal reflux disease without esophagitis: K21.9

## 2019-08-24 LAB — CBC
HCT: 43.8 % (ref 39.0–52.0)
Hemoglobin: 14.9 g/dL (ref 13.0–17.0)
MCH: 32.6 pg (ref 26.0–34.0)
MCHC: 34 g/dL (ref 30.0–36.0)
MCV: 95.8 fL (ref 80.0–100.0)
Platelets: 159 10*3/uL (ref 150–400)
RBC: 4.57 MIL/uL (ref 4.22–5.81)
RDW: 12.4 % (ref 11.5–15.5)
WBC: 6.4 10*3/uL (ref 4.0–10.5)
nRBC: 0 % (ref 0.0–0.2)

## 2019-08-24 LAB — SURGICAL PCR SCREEN
MRSA, PCR: NEGATIVE
Staphylococcus aureus: NEGATIVE

## 2019-08-24 SURGERY — OPEN REDUCTION INTERNAL FIXATION (ORIF) ELBOW/OLECRANON FRACTURE
Anesthesia: General | Site: Elbow | Laterality: Left

## 2019-08-24 MED ORDER — CHLORHEXIDINE GLUCONATE 0.12 % MT SOLN: 15.0000 mL | Freq: Once | OROMUCOSAL | Status: AC

## 2019-08-24 MED ORDER — FENTANYL CITRATE (PF) 250 MCG/5ML IJ SOLN
INTRAMUSCULAR | Status: DC | PRN
  Administered 2019-08-24: 50 ug via INTRAVENOUS
  Administered 2019-08-24: 75 ug via INTRAVENOUS

## 2019-08-24 MED ORDER — ROCURONIUM BROMIDE 10 MG/ML (PF) SYRINGE
PREFILLED_SYRINGE | INTRAVENOUS | Status: DC | PRN
  Administered 2019-08-24: 60 mg via INTRAVENOUS

## 2019-08-24 MED ORDER — ONDANSETRON HCL 4 MG/2ML IJ SOLN
INTRAMUSCULAR | Status: DC | PRN
  Administered 2019-08-24: 4 mg via INTRAVENOUS

## 2019-08-24 MED ORDER — 0.9 % SODIUM CHLORIDE (POUR BTL) OPTIME
TOPICAL | Status: DC | PRN
  Administered 2019-08-24: 1000 mL

## 2019-08-24 MED ORDER — PROPOFOL 10 MG/ML IV BOLUS
INTRAVENOUS | Status: DC | PRN
  Administered 2019-08-24: 50 mg via INTRAVENOUS
  Administered 2019-08-24: 140 mg via INTRAVENOUS

## 2019-08-24 MED ORDER — CHLORHEXIDINE GLUCONATE 0.12 % MT SOLN
OROMUCOSAL | Status: AC
  Administered 2019-08-24: 15 mL via OROMUCOSAL
  Filled 2019-08-24: qty 15

## 2019-08-24 MED ORDER — FENTANYL CITRATE (PF) 100 MCG/2ML IJ SOLN: 25.0000 ug | INTRAMUSCULAR | Status: DC | PRN

## 2019-08-24 MED ORDER — ONDANSETRON HCL 4 MG/2ML IJ SOLN
INTRAMUSCULAR | Status: AC
  Filled 2019-08-24: qty 2

## 2019-08-24 MED ORDER — LACTATED RINGERS IV SOLN: INTRAVENOUS | Status: DC

## 2019-08-24 MED ORDER — OXYCODONE HCL 5 MG PO TABS: 5.0000 mg | ORAL_TABLET | Freq: Once | ORAL | Status: DC | PRN

## 2019-08-24 MED ORDER — LIDOCAINE 2% (20 MG/ML) 5 ML SYRINGE
INTRAMUSCULAR | Status: AC
  Filled 2019-08-24: qty 5

## 2019-08-24 MED ORDER — DEXAMETHASONE SODIUM PHOSPHATE 10 MG/ML IJ SOLN
INTRAMUSCULAR | Status: DC | PRN
  Administered 2019-08-24: 4 mg via INTRAVENOUS

## 2019-08-24 MED ORDER — PROPOFOL 10 MG/ML IV BOLUS
INTRAVENOUS | Status: AC
  Filled 2019-08-24: qty 20

## 2019-08-24 MED ORDER — CEFAZOLIN SODIUM-DEXTROSE 2-3 GM-%(50ML) IV SOLR
INTRAVENOUS | Status: DC | PRN
  Administered 2019-08-24: 2 g via INTRAVENOUS

## 2019-08-24 MED ORDER — SUGAMMADEX SODIUM 200 MG/2ML IV SOLN
INTRAVENOUS | Status: DC | PRN
  Administered 2019-08-24: 180 mg via INTRAVENOUS

## 2019-08-24 MED ORDER — LIDOCAINE 2% (20 MG/ML) 5 ML SYRINGE
INTRAMUSCULAR | Status: DC | PRN
  Administered 2019-08-24: 20 mg via INTRAVENOUS

## 2019-08-24 MED ORDER — OXYCODONE HCL 5 MG/5ML PO SOLN: 5.0000 mg | Freq: Once | ORAL | Status: DC | PRN

## 2019-08-24 MED ORDER — DEXAMETHASONE SODIUM PHOSPHATE 10 MG/ML IJ SOLN
INTRAMUSCULAR | Status: AC
  Filled 2019-08-24: qty 1

## 2019-08-24 MED ORDER — MIDAZOLAM HCL 2 MG/2ML IJ SOLN
INTRAMUSCULAR | Status: AC
  Filled 2019-08-24: qty 2

## 2019-08-24 MED ORDER — CEFAZOLIN SODIUM-DEXTROSE 2-4 GM/100ML-% IV SOLN
INTRAVENOUS | Status: AC
  Filled 2019-08-24: qty 100

## 2019-08-24 MED ORDER — FENTANYL CITRATE (PF) 250 MCG/5ML IJ SOLN
INTRAMUSCULAR | Status: AC
  Filled 2019-08-24: qty 5

## 2019-08-24 MED ORDER — MIDAZOLAM HCL 2 MG/2ML IJ SOLN
INTRAMUSCULAR | Status: DC | PRN
  Administered 2019-08-24 (×2): 1 mg via INTRAVENOUS

## 2019-08-24 MED ORDER — LACTATED RINGERS IV SOLN: INTRAVENOUS | Status: DC | PRN

## 2019-08-24 MED ORDER — ONDANSETRON HCL 4 MG/2ML IJ SOLN: 4.0000 mg | Freq: Once | INTRAMUSCULAR | Status: DC | PRN

## 2019-08-24 MED ORDER — ORAL CARE MOUTH RINSE: 15.0000 mL | Freq: Once | OROMUCOSAL | Status: AC

## 2019-08-24 SURGICAL SUPPLY — 60 items
BNDG CMPR 9X4 STRL LF SNTH (GAUZE/BANDAGES/DRESSINGS) ×1
BNDG COHESIVE 4X5 TAN STRL (GAUZE/BANDAGES/DRESSINGS) ×3 IMPLANT
BNDG ELASTIC 3X5.8 VLCR STR LF (GAUZE/BANDAGES/DRESSINGS) ×3 IMPLANT
BNDG ELASTIC 4X5.8 VLCR STR LF (GAUZE/BANDAGES/DRESSINGS) ×5 IMPLANT
BNDG ESMARK 4X9 LF (GAUZE/BANDAGES/DRESSINGS) ×3 IMPLANT
BNDG GAUZE ELAST 4 BULKY (GAUZE/BANDAGES/DRESSINGS) ×3 IMPLANT
CANISTER SUCT 3000ML PPV (MISCELLANEOUS) IMPLANT
CLOSURE WOUND 1/2 X4 (GAUZE/BANDAGES/DRESSINGS)
CONNECTOR 5 IN 1 STRAIGHT STRL (MISCELLANEOUS) ×3 IMPLANT
COVER SURGICAL LIGHT HANDLE (MISCELLANEOUS) ×3 IMPLANT
CUFF TOURN SGL QUICK 18X4 (TOURNIQUET CUFF) ×3 IMPLANT
DRAPE IMP U-DRAPE 54X76 (DRAPES) ×3 IMPLANT
DRAPE INCISE IOBAN 66X45 STRL (DRAPES) IMPLANT
DRAPE OEC MINIVIEW 54X84 (DRAPES) ×3 IMPLANT
DRAPE U-SHAPE 47X51 STRL (DRAPES) ×3 IMPLANT
DRSG ADAPTIC 3X8 NADH LF (GAUZE/BANDAGES/DRESSINGS) ×2 IMPLANT
DRSG PAD ABDOMINAL 8X10 ST (GAUZE/BANDAGES/DRESSINGS) ×6 IMPLANT
DURAPREP 26ML APPLICATOR (WOUND CARE) ×3 IMPLANT
ELECT REM PT RETURN 9FT ADLT (ELECTROSURGICAL) ×3
ELECTRODE REM PT RTRN 9FT ADLT (ELECTROSURGICAL) ×1 IMPLANT
FIBERTAPE CERCLAGESUTURE ×2 IMPLANT
GAUZE SPONGE 4X4 12PLY STRL (GAUZE/BANDAGES/DRESSINGS) ×3 IMPLANT
GAUZE XEROFORM 5X9 LF (GAUZE/BANDAGES/DRESSINGS) ×3 IMPLANT
GLOVE BIO SURGEON STRL SZ7.5 (GLOVE) ×6 IMPLANT
GLOVE BIOGEL PI IND STRL 8 (GLOVE) ×2 IMPLANT
GLOVE BIOGEL PI INDICATOR 8 (GLOVE) ×4
GOWN STRL REUS W/ TWL LRG LVL3 (GOWN DISPOSABLE) ×2 IMPLANT
GOWN STRL REUS W/ TWL XL LVL3 (GOWN DISPOSABLE) ×1 IMPLANT
GOWN STRL REUS W/TWL LRG LVL3 (GOWN DISPOSABLE) ×6
GOWN STRL REUS W/TWL XL LVL3 (GOWN DISPOSABLE) ×3
KIT BASIN OR (CUSTOM PROCEDURE TRAY) ×3 IMPLANT
KIT TURNOVER KIT B (KITS) ×3 IMPLANT
MANIFOLD NEPTUNE II (INSTRUMENTS) ×2 IMPLANT
NEEDLE HYPO 22GX1.5 SAFETY (NEEDLE) IMPLANT
NS IRRIG 1000ML POUR BTL (IV SOLUTION) ×3 IMPLANT
PACK ORTHO EXTREMITY (CUSTOM PROCEDURE TRAY) ×3 IMPLANT
PAD ARMBOARD 7.5X6 YLW CONV (MISCELLANEOUS) ×3 IMPLANT
PAD CAST 4YDX4 CTTN HI CHSV (CAST SUPPLIES) ×1 IMPLANT
PADDING CAST COTTON 4X4 STRL (CAST SUPPLIES) ×9
SLING ARM IMMOBILIZER LRG (SOFTGOODS) ×2 IMPLANT
SPONGE LAP 18X18 RF (DISPOSABLE) IMPLANT
STOCKINETTE IMPERVIOUS 9X36 MD (GAUZE/BANDAGES/DRESSINGS) ×3 IMPLANT
STRIP CLOSURE SKIN 1/2X4 (GAUZE/BANDAGES/DRESSINGS) IMPLANT
SUCTION FRAZIER HANDLE 10FR (MISCELLANEOUS) ×3
SUCTION TUBE FRAZIER 10FR DISP (MISCELLANEOUS) ×1 IMPLANT
SUT FIBERTAPE CERCLAGE 2 48 (SUTURE) ×2 IMPLANT
SUT MNCRL AB 4-0 PS2 18 (SUTURE) IMPLANT
SUT MON AB 2-0 CT1 36 (SUTURE) IMPLANT
SUT VIC AB 0 CT1 27 (SUTURE) ×3
SUT VIC AB 0 CT1 27XBRD ANBCTR (SUTURE) ×1 IMPLANT
SUT VIC AB 3-0 SH 27 (SUTURE) ×3
SUT VIC AB 3-0 SH 27X BRD (SUTURE) IMPLANT
SYR CONTROL 10ML LL (SYRINGE) IMPLANT
TOWEL GREEN STERILE (TOWEL DISPOSABLE) ×3 IMPLANT
TOWEL GREEN STERILE FF (TOWEL DISPOSABLE) ×3 IMPLANT
TUBE CONNECTING 12'X1/4 (SUCTIONS) ×2
TUBE CONNECTING 12X1/4 (SUCTIONS) ×4 IMPLANT
UNDERPAD 30X36 HEAVY ABSORB (UNDERPADS AND DIAPERS) ×3 IMPLANT
WATER STERILE IRR 1000ML POUR (IV SOLUTION) ×3 IMPLANT
YANKAUER SUCT BULB TIP NO VENT (SUCTIONS) ×3 IMPLANT

## 2019-08-24 NOTE — Transfer of Care (Signed)
Immediate Anesthesia Transfer of Care Note  Patient: Brent Chapman  Procedure(s) Performed: OPEN REDUCTION INTERNAL FIXATION (ORIF) ELBOW/OLECRANON FRACTURE (Left Elbow)  Patient Location: PACU  Anesthesia Type:General  Level of Consciousness: awake and alert   Airway & Oxygen Therapy: Patient Spontanous Breathing and Patient connected to face mask oxygen  Post-op Assessment: Report given to RN and Post -op Vital signs reviewed and stable  Post vital signs: Reviewed and stable  Last Vitals:  Vitals Value Taken Time  BP 115/71 08/24/19 0920  Temp 36.6 C 08/24/19 0920  Pulse 67 08/24/19 0921  Resp 14 08/24/19 0921  SpO2 97 % 08/24/19 0921  Vitals shown include unvalidated device data.  Last Pain:  Vitals:   08/24/19 0712  TempSrc: Oral  PainSc: 0-No pain      Patients Stated Pain Goal: 3 (08/24/19 5102)  Complications: No complications documented.

## 2019-08-24 NOTE — Anesthesia Preprocedure Evaluation (Addendum)
Anesthesia Evaluation  Patient identified by MRN, date of birth, ID band Patient awake    Reviewed: Allergy & Precautions, NPO status , Patient's Chart, lab work & pertinent test results  Airway Mallampati: II  TM Distance: >3 FB Neck ROM: Full    Dental  (+) Teeth Intact, Dental Advisory Given   Pulmonary    breath sounds clear to auscultation       Cardiovascular  Rhythm:Regular Rate:Normal     Neuro/Psych    GI/Hepatic   Endo/Other    Renal/GU      Musculoskeletal   Abdominal   Peds  Hematology   Anesthesia Other Findings   Reproductive/Obstetrics                            Anesthesia Physical Anesthesia Plan  ASA: I  Anesthesia Plan: General   Post-op Pain Management:  Regional for Post-op pain   Induction:   PONV Risk Score and Plan: Dexamethasone and Ondansetron  Airway Management Planned: Oral ETT  Additional Equipment:   Intra-op Plan:   Post-operative Plan: Extubation in OR  Informed Consent: I have reviewed the patients History and Physical, chart, labs and discussed the procedure including the risks, benefits and alternatives for the proposed anesthesia with the patient or authorized representative who has indicated his/her understanding and acceptance.     Dental advisory given  Plan Discussed with: CRNA and Anesthesiologist  Anesthesia Plan Comments:        Anesthesia Quick Evaluation

## 2019-08-24 NOTE — Anesthesia Postprocedure Evaluation (Signed)
Anesthesia Post Note  Patient: Brent Chapman  Procedure(s) Performed: OPEN REDUCTION INTERNAL FIXATION (ORIF) ELBOW/OLECRANON FRACTURE (Left Elbow)     Patient location during evaluation: PACU Anesthesia Type: General Level of consciousness: awake and alert Pain management: pain level controlled Vital Signs Assessment: post-procedure vital signs reviewed and stable Respiratory status: spontaneous breathing, nonlabored ventilation, respiratory function stable and patient connected to nasal cannula oxygen Cardiovascular status: blood pressure returned to baseline and stable Postop Assessment: no apparent nausea or vomiting Anesthetic complications: no   No complications documented.  Last Vitals:  Vitals:   08/24/19 0936 08/24/19 0950  BP:  110/72  Pulse: 62 60  Resp:  16  Temp:    SpO2:  99%    Last Pain:  Vitals:   08/24/19 0950  TempSrc:   PainSc: 0-No pain                 Christianjames Soule COKER

## 2019-08-24 NOTE — Discharge Instructions (Signed)
Diet: As you were doing prior to hospitalization  ? ?Shower:  May shower but keep the wounds dry, use an occlusive plastic wrap, NO SOAKING IN TUB.  If the bandage gets wet, change with a clean dry gauze.  If you have a splint on, leave the splint in place and keep the splint dry with a plastic bag. ? ?Dressing:  You may change your dressing 3-5 days after surgery, unless you have a splint.  If you have a splint, then just leave the splint in place and we will change your bandages during your first follow-up appointment.   ? ?If you had hand or foot surgery, we will plan to remove your stitches in about 2 weeks in the office.  For all other surgeries, there are sticky tapes (steri-strips) on your wounds and all the stitches are absorbable.  Leave the steri-strips in place when changing your dressings, they will peel off with time, usually 2-3 weeks. ? ?Activity:  Increase activity slowly as tolerated, but follow the weight bearing instructions below.  The rules on driving is that you can not be taking narcotics while you drive, and you must feel in control of the vehicle.   ? ?Weight Bearing:  No weight bearing with left arm. ? ?To prevent constipation: you may use a stool softener such as - ? ?Colace (over the counter) 100 mg by mouth twice a day  ?Drink plenty of fluids (prune juice may be helpful) and high fiber foods ?Miralax (over the counter) for constipation as needed.   ? ?Itching:  If you experience itching with your medications, try taking only a single pain pill, or even half a pain pill at a time.  You may take up to 10 pain pills per day, and you can also use benadryl over the counter for itching or also to help with sleep.  ? ?Precautions:  If you experience chest pain or shortness of breath - call 911 immediately for transfer to the hospital emergency department!! ? ?If you develop a fever greater that 101 F, purulent drainage from wound, increased redness or drainage from wound, or calf pain -- Call  the office at [phone]                                                ?Follow- Up Appointment:  Please call for an appointment to be seen in 2 weeks [place] - [phone] ? ? ?  ?

## 2019-08-24 NOTE — Interval H&P Note (Signed)
History and Physical Interval Note:  08/24/2019 7:38 AM  Brent Chapman  has presented today for surgery, with the diagnosis of Left elbow Fracture.  The various methods of treatment have been discussed with the patient and family. After consideration of risks, benefits and other options for treatment, the patient has consented to  Procedure(s): OPEN REDUCTION INTERNAL FIXATION (ORIF) ELBOW/OLECRANON FRACTURE (Left) as a surgical intervention.  The patient's history has been reviewed, patient examined, no change in status, stable for surgery.  I have reviewed the patient's chart and labs.  Questions were answered to the patient's satisfaction.     Sheral Apley
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
## 2019-08-24 NOTE — Anesthesia Procedure Notes (Signed)
Anesthesia Regional Block: Supraclavicular block   Pre-Anesthetic Checklist: ,, timeout performed, Correct Patient, Correct Site, Correct Laterality, Correct Procedure, Correct Position, site marked, Risks and benefits discussed,  Surgical consent,  Pre-op evaluation,  At surgeon's request and post-op pain management  Laterality: Left  Prep: chloraprep       Needles:  Injection technique: Single-shot  Needle Type: Stimulator Needle - 80          Additional Needles:   Procedures:, nerve stimulator,,,,,,,  Narrative:  Start time: 08/24/2019 7:20 AM End time: 08/24/2019 7:30 AM Injection made incrementally with aspirations every 5 mL.  Performed by: Personally   Additional Notes: 25 cc 0.5% Bupivacaine 1:200 epi 10 cc 1.3% Exparel

## 2019-08-24 NOTE — Discharge Summary (Signed)
Discharge Summary  Patient ID: Brent Chapman MRN: [id number] DOB/AGE: [birth year] 64 y.o.  Admit date: 08/24/2019 Discharge date: 08/24/2019  Admission Diagnoses:  Closed fracture of left olecranon process  Discharge Diagnoses:  Principal Problem:   Closed fracture of left olecranon process   Past Medical History:  Diagnosis Date  . Gastritis    NSAID induced  . GERD (gastroesophageal reflux disease)   . Hyperlipidemia   . Rotator cuff syndrome of left shoulder 01/10/2011   Classic impingement findings on exam   . Stomach ulcer     Surgeries: Procedure(s): OPEN REDUCTION INTERNAL FIXATION (ORIF) ELBOW/OLECRANON FRACTURE on 08/24/2019   Consultants (if any):   Discharged Condition: Improved  Hospital Course: Brent Chapman is an 64 y.o. male who was admitted 08/24/2019 with a diagnosis of Closed fracture of left olecranon process and went to the operating room on 08/24/2019 and underwent the above named procedures.    He was given perioperative antibiotics:  Anti-infectives (From admission, onward)   Start     Dose/Rate Route Frequency Ordered Stop   08/24/19 0723  ceFAZolin (ANCEF) 2-4 GM/100ML-% IVPB       Note to Pharmacy: Oswaldo Milian   : cabinet override      08/24/19 0723 08/24/19 1929    .  He was given sequential compression devices and early ambulation for DVT prophylaxis.  He benefited maximally from the hospital stay and there were no complications.    Recent vital signs:  Vitals:   08/24/19 0936 08/24/19 0950  BP:  110/72  Pulse: 62 60  Resp:  16  Temp:    SpO2:  99%    Recent laboratory studies:  Lab Results  Component Value Date   HGB 14.9 08/24/2019   HGB 9.6 (L) 12/12/2013   HGB 9.7 (L) 12/11/2013   Lab Results  Component Value Date   WBC 6.4 08/24/2019   PLT 159 08/24/2019   No results found for: INR Lab Results  Component Value Date   NA 140 12/12/2013   K 3.7 12/12/2013   CL 106 12/12/2013   CO2 25 12/12/2013   BUN 20  12/12/2013   CREATININE 0.99 12/12/2013   GLUCOSE 105 (H) 12/12/2013    Discharge Medications:   Allergies as of 08/24/2019      Reactions   Nsaids    Stomach ulcer      Medication List    STOP taking these medications   pantoprazole 40 MG tablet Commonly known as: Protonix     TAKE these medications   atorvastatin 20 MG tablet Commonly known as: LIPITOR 20 mg daily.   cephALEXin 500 MG capsule Commonly known as: KEFLEX Take 500 mg by mouth 4 (four) times daily.   nitroGLYCERIN 0.2 mg/hr patch Commonly known as: NITRODUR - Dosed in mg/24 hr Apply 1/4th patch to affected achilles, change daily   oxyCODONE-acetaminophen 5-325 MG tablet Commonly known as: PERCOCET/ROXICET Take 1 tablet by mouth every 6 (six) hours as needed for severe pain.   sildenafil 25 MG tablet Commonly known as: VIAGRA Take 100 mg by mouth daily as needed for erectile dysfunction.       Diagnostic Studies: DG MINI C-ARM IMAGE ONLY  Result Date: 08/24/2019 There is no interpretation for this exam.  This order is for images obtained during a surgical procedure.  Please See "Surgeries" Tab for more information regarding the procedure.    Disposition: Discharge disposition: 01-Home or Self Care  Follow-up Information    Sheral Apley, MD. Schedule an appointment as soon as possible for a visit in 2 weeks.   Specialty: Orthopedic Surgery Contact information: [address] [place] [address] [phone]                Signed: Annita Brod 08/24/2019, 12:43 PM

## 2019-08-24 NOTE — Anesthesia Procedure Notes (Signed)
Procedure Name: Intubation Date/Time: 08/24/2019 7:53 AM Performed by: Bryson Corona, CRNA Pre-anesthesia Checklist: Patient identified, Emergency Drugs available, Suction available and Patient being monitored Patient Re-evaluated:Patient Re-evaluated prior to induction Oxygen Delivery Method: Circle System Utilized Preoxygenation: Pre-oxygenation with 100% oxygen Induction Type: IV induction Ventilation: Oral airway inserted - appropriate to patient size and Two handed mask ventilation required Laryngoscope Size: Mac and 4 Grade View: Grade I Tube type: Oral Tube size: 7.0 mm Number of attempts: 1 Airway Equipment and Method: Stylet and Oral airway Placement Confirmation: ETT inserted through vocal cords under direct vision,  positive ETCO2 and breath sounds checked- equal and bilateral Secured at: 22 cm Tube secured with: Tape Dental Injury: Teeth and Oropharynx as per pre-operative assessment  Comments: 2 handed mask due to pts beard.

## 2019-08-26 ENCOUNTER — Encounter (HOSPITAL_COMMUNITY): Payer: Self-pay | Admitting: Orthopedic Surgery

## 2019-08-26 NOTE — Op Note (Signed)
08/24/2019  10:00 AM  PATIENT:  Brent Chapman    PRE-OPERATIVE DIAGNOSIS:  Left elbow Fracture  POST-OPERATIVE DIAGNOSIS:  Same  PROCEDURE:  OPEN REDUCTION INTERNAL FIXATION (ORIF) ELBOW/OLECRANON FRACTURE  SURGEON:  Sheral Apley, MD  ASSISTANT: Aquilla Hacker, PA-C, he was present and scrubbed throughout the case, critical for completion in a timely fashion, and for retraction, instrumentation, and closure.   ANESTHESIA:   gen  PREOPERATIVE INDICATIONS:  Brent Chapman is a  64 y.o. male with a diagnosis of Left elbow Fracture who failed conservative measures and elected for surgical management.    The risks benefits and alternatives were discussed with the patient preoperatively including but not limited to the risks of infection, bleeding, nerve injury, cardiopulmonary complications, the need for revision surgery, among others, and the patient was willing to proceed.  OPERATIVE IMPLANTS: tension band with K wires and Fibertape  OPERATIVE FINDINGS: unstable olecranon fracture  BLOOD LOSS: minimal  COMPLICATIONS: non  TOURNIQUET TIME: see operative report  OPERATIVE PROCEDURE:  Patient was identified in the preoperative holding area and site was marked by me He was transported to the operating theater and placed on the table in lateral position taking care to pad all bony prominences. After a preincinduction time out anesthesia was induced. The left upper extremity was prepped and draped in normal sterile fashion and a pre-incision timeout was performed. He received ancef for preoperative antibiotics.   I made an incision directly over the olecranon fracture.  Ulnar nerve was protected throughout.  I dissected down to the periosteum and this was incised over the fracture.  I then removed any soft tissue from within the fracture and reduce this fracture and held it with a tenaculum  2 K wires were then placed across the fracture in a bicortical fashion.  I used  multiple fluoroscopic views of the elbow to confirm this.  I also confirmed anatomic alignment  I then drilled 2 holes distally in the ulna and was able to pass the fiber tape sinch through the interosseous tunnel and then in a figure-of-eight fashion around the K wires.  I tension this.  I then backed the K wires up bent and tamped them back down tucking them below the triceps.  I examined 4 views of the olecranon with fluoroscopy was happy with the fracture reduction and hardware alignment thorough irrigation was performed prior to pinning of the joint and of the wound.  Skin was then closed in layers sterile dressing and a long-arm splint was applied patient was awoken and taken to the PACU in stable condition  POST OPERATIVE PLAN: sling full time. Dvt px: mobilize and chemical when indicated

## 2019-11-26 ENCOUNTER — Ambulatory Visit: Payer: Self-pay

## 2019-11-26 ENCOUNTER — Ambulatory Visit (INDEPENDENT_AMBULATORY_CARE_PROVIDER_SITE_OTHER): Payer: BC Managed Care – PPO | Admitting: Family Medicine

## 2019-11-26 ENCOUNTER — Other Ambulatory Visit: Payer: Self-pay

## 2019-11-26 VITALS — BP 102/78 | Ht 74.0 in | Wt 195.0 lb

## 2019-11-26 DIAGNOSIS — M25561 Pain in right knee: Secondary | ICD-10-CM

## 2019-11-26 DIAGNOSIS — M25552 Pain in left hip: Secondary | ICD-10-CM | POA: Diagnosis not present

## 2019-11-26 NOTE — Patient Instructions (Signed)
You have proximal IT band syndrome and strain of your gluteus minimus. Avoid painful activities as much as possible. Ice over area of pain 3-4 times a day for 15 minutes at a time Tylenol, topical biofreeze or aspercreme if needed. Hip side raise exercise 3 sets of 10 once a day - add weights if this becomes too easy. Stretches - pick 2-3 and hold for 20-30 seconds x 3 - do once or twice a day. Follow up with me in 1 month for reevaluation.  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 Ice this 15 minutes at a time 3-4 times a day. Do home exercises for the muscles that insert into this area. If not improving we can consider a steroid injection into the bursa and/or formal physical therapy.

## 2019-11-27 ENCOUNTER — Encounter: Payer: Self-pay | Admitting: Family Medicine

## 2019-11-27 NOTE — Progress Notes (Signed)
PCP: [name], MD  Subjective:   HPI: Patient is a 64 y.o. male here for right knee, left hip pain.  Patient was seen previously about 6 months ago for right shin/knee pain. At the time had pain related to running, noted to have stress reaction. He rested from running for about 2 months then started to do walk/jog program without issues. He suffered injury to left elbow so again took some time off (about 6 weeks). He has started to slowly get back into jogging - [place] and noted pain medial proximal tibia area and difficulty bearing weight. Perhaps some swelling noted by his wife. No bruising. Since that time he's been mainly doing aqua running, swimming. Also started to get superolateral left hip pain about 1.5 weeks ago - notices when going from sitting to standing.  Also worse lying on left side.  Past Medical History:  Diagnosis Date  . Gastritis    NSAID induced  . GERD (gastroesophageal reflux disease)   . Hyperlipidemia   . Rotator cuff syndrome of left shoulder 01/10/2011   Classic impingement findings on exam   . Stomach ulcer     Current Outpatient Medications on File Prior to Visit  Medication Sig Dispense Refill  . atorvastatin (LIPITOR) 20 MG tablet 20 mg daily.     . nitroGLYCERIN (NITRODUR - DOSED IN MG/24 HR) 0.2 mg/hr patch Apply 1/4th patch to affected achilles, change daily 30 patch 1  . sildenafil (VIAGRA) 25 MG tablet Take 100 mg by mouth daily as needed for erectile dysfunction.     No current facility-administered medications on file prior to visit.    Past Surgical History:  Procedure Laterality Date  . ESOPHAGOGASTRODUODENOSCOPY Left 12/12/2013   Procedure: ESOPHAGOGASTRODUODENOSCOPY (EGD);  Surgeon: Charna Elizabeth, MD;  Location: [facility];  Service: Endoscopy;  Laterality: Left;  . INGUINAL HERNIA REPAIR Left   . ORIF ELBOW FRACTURE Left 08/24/2019   Procedure: OPEN REDUCTION INTERNAL FIXATION (ORIF) ELBOW/OLECRANON  FRACTURE;  Surgeon: Sheral Apley, MD;  Location: [facility] OR;  Service: Orthopedics;  Laterality: Left;    Allergies  Allergen Reactions  . Nsaids     Stomach ulcer    Social History   Socioeconomic History  . Marital status: Married    Spouse name: Marjean Donna  . Number of children: 2  . Years of education: Not on file  . Highest education level: Not on file  Occupational History  . Occupation: Tourist information centre manager  Tobacco Use  . Smoking status: Never Smoker  . Smokeless tobacco: Never Used  Vaping Use  . Vaping Use: Never used  Substance and Sexual Activity  . Alcohol use: Yes    Alcohol/week: 0.0 standard drinks    Comment: 2 beers a week.  . Drug use: No  . Sexual activity: Not Currently  Other Topics Concern  . Not on file  Social History Narrative   Married.  Independent of ADLs.   Social Determinants of Health   Financial Resource Strain:   . Difficulty of Paying Living Expenses: Not on file  Food Insecurity:   . Worried About Programme researcher, broadcasting/film/video in the Last Year: Not on file  . Ran Out of Food in the Last Year: Not on file  Transportation Needs:   . Lack of Transportation (Medical): Not on file  . Lack of Transportation (Non-Medical): Not on file  Physical Activity:   . Days of Exercise per Week: Not on file  . Minutes of  Exercise per Session: Not on file  Stress:   . Feeling of Stress : Not on file  Social Connections:   . Frequency of Communication with Friends and Family: Not on file  . Frequency of Social Gatherings with Friends and Family: Not on file  . Attends Religious Services: Not on file  . Active Member of Clubs or Organizations: Not on file  . Attends Banker Meetings: Not on file  . Marital Status: Not on file  Intimate Partner Violence:   . Fear of Current or Ex-Partner: Not on file  . Emotionally Abused: Not on file  . Physically Abused: Not on file  . Sexually Abused: Not on file    Family History  Problem Relation  Age of Onset  . Cancer Father        Prostate  . Sudden death Neg Hx   . Hypertension Neg Hx   . Hyperlipidemia Neg Hx   . Heart attack Neg Hx   . Diabetes Neg Hx     BP 102/78   Ht 6\' 2"  (1.88 m)   Wt 195 lb (88.5 kg)   BMI 25.04 kg/m   [facility] 11/26/2019  Frequency of aerobic exercise (# of days/week) 4  Average time in minutes 30  Frequency of strengthening activities (# of days/week) 0    No flowsheet data found.  Review of Systems: See HPI above.     Objective:  Physical Exam:  Gen: NAD, comfortable in exam room  Right knee/lower leg: No gross deformity, ecchymoses, swelling. TTP over pes bursa.  No joint line, other tenderness. FROM with normal strength - no pain resisted knee flexion or sartorius testing. Negative ant/post drawers. Negative valgus/varus testing. Negative lachmans. Negative mcmurrays, apleys, patellar apprehension. NV intact distally.  Left hip: No deformity. FROM with 5/5 strength. Tenderness to palpation mildly over proximal IT band, glut minimus. NVI distally. Negative logroll, faber, fadir, piriformis.  Mild tightness with ober's.  Negative noble but pain proximally with this.   Limited MSK u/s right shin:  No cortical irregularity or edema overlying bony cortex in area of pain.  Area corresponds to pes insertion and noted fluid superficial to pes tendons.  Assessment & Plan:  1. Right pes bursitis - start home exercises, icing, tylenol, topical medications.  Consider formal PT, injection if not improving.  2. Left hip pain - consistent with proximal IT band syndrome with glut minimus strain.  Icing, tylenol, topical medications.  Home exercises reviewed.  F/u in 1 month.  Consider formal PT.

## 2019-12-30 ENCOUNTER — Other Ambulatory Visit: Payer: Self-pay

## 2019-12-30 ENCOUNTER — Ambulatory Visit (INDEPENDENT_AMBULATORY_CARE_PROVIDER_SITE_OTHER): Payer: BC Managed Care – PPO | Admitting: Sports Medicine

## 2019-12-30 VITALS — BP 124/86 | Ht 74.0 in | Wt 195.0 lb

## 2019-12-30 DIAGNOSIS — M79672 Pain in left foot: Secondary | ICD-10-CM | POA: Diagnosis not present

## 2019-12-30 NOTE — Progress Notes (Signed)
Brent Chapman is a 64 y.o. male who presents to [facility] today for the following:  Left foot pain Patient has been recovering from right tibial stress fracture and working on his recovery phase to get back to running He reports that he is currently running for 2 minutes and walking for 2 minutes 2 days ago, he did this for 30 minutes and had no pain Yesterday morning, he woke up and had significant pain with his first step after getting out of bed Pain was first on the dorsum of his left foot overlying first metatarsal and tarsometatarsal joint He was going camping yesterday so he put on an ankle compression sleeve Reports that since then he has had pain on the plantar aspect of his foot radiating from his heel up into his toes He has been taking Tylenol 2 tablets every 8 hours and states that it is not helping He cannot take Advil Reports that 7 years ago he thinks he had something similar when he was running sprints after running distance for quite some time    PMH reviewed.  ROS as above. Medications reviewed.  Exam:  BP 124/86   Ht 6\' 2"  (1.88 m)   Wt 195 lb (88.5 kg)   BMI 25.04 kg/m  Gen: Well NAD MSK:  Left foot: Inspection:  No obvious bony deformity.  No swelling, erythema, or bruising.  Moderate pronation of bilateral feet with collapse of arch. Palpation: TTP over left 1st TMT joint.  No TTP over 1st metatarsal and 1st MTP specifically.  TTP origin of plantar fascia on left and along length of plantar fascia. ROM: Full  ROM of the ankle b/l. Normal midfoot flexibility b/l Strength: 5/5 strength ankle in all planes b/l Neurovascular: N/V intact distally in the lower extremity b/l Special tests: Negative anterior drawer. Negative squeeze. normal midfoot flexibility b/l   Limited left foot:  Small hypoechoic collection over first TMT and area of patient's pain.  No spurring noted.  Consistent with effusion.  No bony irregularity or overlying hypoechoic collections  along the length of first MT.  First MTP without hypoechoic collection or spurring noted.  Left plantar fascia with disorganized fibers and increased hypoechoic areas throughout, measures 0.62 mm.  Right plantar fascia with organized fibers, measures 0.43 mm. Impression: Effusion over first TMT, no obvious spurring, likely underlying osteoarthritis and inflammation.  No sign of stress fracture and first metatarsal.  He also appears to have changes consistent with plantar fasciitis of his left foot.    Assessment and Plan: 1) Left foot pain Acute in nature, started yesterday.  He does appear to have an effusion over his first TMT, no obvious spurring, but could be consistent with inflammation secondary to underlying arthritis.  He also has some plantar fasciitis, but this is most likely not the main cause of his symptoms.  Could have been exacerbated by use of compression sleeve.  For now, fit him with a green insole and scaphoid pad, however he was too sore to wear, therefore he will continue icing for now and doing nonimpact exercises over the next week.  He was also given crutches to use given that he has pain with weightbearing.  He will follow-up next week.  At that time, hopefully can get him into the insole and back to running to see if this will help.  For now, would hold off on specific exercises.    [place], D.O.  PGY-3 Family Medicine  12/30/2019 5:02 PM  Patient seen and evaluated with the resident.  I agree with the above plan of care.  Treatment as above and follow-up in 1 week.  If symptoms persist at that time consider merits of further diagnostic imaging.

## 2019-12-30 NOTE — Assessment & Plan Note (Signed)
Acute in nature, started yesterday.  He does appear to have an effusion over his first TMT, no obvious spurring, but could be consistent with inflammation secondary to underlying arthritis.  He also has some plantar fasciitis, but this is most likely not the main cause of his symptoms.  Could have been exacerbated by use of compression sleeve.  For now, fit him with a green insole and scaphoid pad, however he was too sore to wear, therefore he will continue icing for now and doing nonimpact exercises over the next week.  He was also given crutches to use given that he has pain with weightbearing.  He will follow-up next week.  At that time, hopefully can get him into the insole and back to running to see if this will help.  For now, would hold off on specific exercises.

## 2020-01-08 ENCOUNTER — Ambulatory Visit (INDEPENDENT_AMBULATORY_CARE_PROVIDER_SITE_OTHER): Payer: BC Managed Care – PPO | Admitting: Sports Medicine

## 2020-01-08 ENCOUNTER — Other Ambulatory Visit: Payer: Self-pay

## 2020-01-08 ENCOUNTER — Ambulatory Visit
Admission: RE | Admit: 2020-01-08 | Discharge: 2020-01-08 | Disposition: A | Discharge status: Home / Self Care | Payer: BC Managed Care – PPO | Source: Ambulatory Visit | Attending: Sports Medicine | Admitting: Sports Medicine

## 2020-01-08 VITALS — BP 108/82 | Ht 74.0 in | Wt 195.0 lb

## 2020-01-08 DIAGNOSIS — M79672 Pain in left foot: Secondary | ICD-10-CM | POA: Diagnosis not present

## 2020-01-08 DIAGNOSIS — M7989 Other specified soft tissue disorders: Secondary | ICD-10-CM | POA: Diagnosis not present

## 2020-01-09 NOTE — Progress Notes (Signed)
° °  Subjective:    Patient ID: Brent Chapman, male    DOB: 1955/03/18, 64 y.o.   MRN: [id number]  HPI   Brent Chapman comes in today for follow-up on left foot pain.  His pain has worsened.  He has also developed swelling on the dorsum of his foot.  In addition to pain on the dorsum of his foot he still has pain at the calcaneal origin of the plantar fascia.  His pain and swelling are worse at the end of the day.    Review of Systems   Above Objective:   Physical Exam  Well-developed, well-nourished.  No acute distress  Left foot: There is moderate swelling diffusely across the dorsum of the foot.  He has tenderness to palpation diffusely across the dorsum of the foot as well as at the navicular.  Pain with metatarsal squeeze.  Tenderness to palpation at the calcaneal origin of the plantar fascia.  Good pulses.  Walking with a limp.  X-rays of the foot including AP, lateral, and oblique views show no obvious bony abnormality.      Assessment & Plan:   Left foot pain and swelling worrisome for stress fracture  Although the patient has signs and symptoms consistent with plantar fasciitis, this does not explain the swelling or the pain on the dorsum of his foot.  Therefore, we will need to proceed with an MRI specifically to rule out a stress fracture of both the metatarsals as well as the navicular.  Phone follow-up with those results when available and we will delineate a more definitive treatment plan based on those findings.  In the meantime, patient will be placed into a short cam walker and may weight-bear as tolerated with the assistance of his crutches if needed.

## 2020-01-29 ENCOUNTER — Ambulatory Visit
Admission: RE | Admit: 2020-01-29 | Discharge: 2020-01-29 | Disposition: A | Discharge status: Home / Self Care | Payer: BC Managed Care – PPO | Source: Ambulatory Visit | Attending: Sports Medicine | Admitting: Sports Medicine

## 2020-01-29 ENCOUNTER — Other Ambulatory Visit: Payer: Self-pay

## 2020-01-29 DIAGNOSIS — M79672 Pain in left foot: Secondary | ICD-10-CM

## 2020-02-16 ENCOUNTER — Other Ambulatory Visit: Payer: Self-pay

## 2020-02-16 ENCOUNTER — Ambulatory Visit: Payer: BC Managed Care – PPO | Admitting: Sports Medicine

## 2020-02-16 VITALS — BP 114/82 | Ht 74.0 in | Wt 195.0 lb

## 2020-02-16 DIAGNOSIS — M79672 Pain in left foot: Secondary | ICD-10-CM

## 2020-02-16 NOTE — Progress Notes (Signed)
Patient ID: Brent Chapman, male   DOB: 04-Oct-1955, 65 y.o.   MRN: [id number]  Patient comes in today for follow-up on a left foot MRI.  Brent Chapman's dorsal foot pain and swelling have nearly completely resolved.  He wore his walking boot for about 2 weeks and then discontinued it.  He has not yet resumed running.  He still endorses typical plantar fascial pain in his heel.  He has been doing some stretches and some exercises for this.  The MRI of his ankle and foot showed no obvious stress fracture in the metatarsals but he did have several incidental findings including peroneal tendinosis and Achilles peritendinitis.  He also has evidence of plantar fasciitis which I think is definitely relevant.    Physical exam of his left foot shows full ankle range of motion.  Previous soft tissue swelling across the dorsum of his foot has resolved.  There is no tenderness to palpation along the dorsum of the foot.  No pain with metatarsal squeeze.  No tenderness to palpation along the peroneal tendons nor at the Achilles tendon.  There is some tenderness to palpation at the calcaneal origin of the plantar fascia.  Good pulses.  Walking without a limp.  At this point in time I think Brent Chapman is okay to resume running.  He will need to go slow and use pain as his guide.  I have given him 1 additional plantar fascial stretch to add to his repertoire and have encouraged icing after running.  He may follow-up with me as needed.

## 2020-07-30 ENCOUNTER — Ambulatory Visit: Payer: BC Managed Care – PPO | Admitting: Sports Medicine

## 2020-07-30 ENCOUNTER — Encounter: Payer: Self-pay | Admitting: Sports Medicine

## 2020-07-30 ENCOUNTER — Other Ambulatory Visit: Payer: Self-pay

## 2020-07-30 VITALS — BP 110/78 | Ht 74.0 in | Wt 198.0 lb

## 2020-07-30 DIAGNOSIS — M25552 Pain in left hip: Secondary | ICD-10-CM

## 2020-07-30 DIAGNOSIS — M79672 Pain in left foot: Secondary | ICD-10-CM

## 2020-07-30 NOTE — Assessment & Plan Note (Signed)
Acute. Dorsal distal 5th metatarsal pain with ambulation. Does appear to be connected with his supinating gait. Suspect exacerbated by arch support of insert. Low suspicion for stress fracture at this time. Remaining exam normal. Provided new insert with less arch support. Recommended skipping last shoe-lace eyelet to allow more toe-box room. Recommended follow up in 4 weeks to evaluate, sooner if appears no improvement or worsening.

## 2020-07-30 NOTE — Assessment & Plan Note (Signed)
Chronic. Still appears consistent with greater trochanter bursitis. Still has some weakness in hip abduction but gluteus medius strength seems improved. Recommended weight hip abduction exercises and trial of physical therapy. He was amendable to this plan. Follow up in 4 weeks.

## 2020-07-30 NOTE — Patient Instructions (Signed)
Use the new inserts and follow up in 1 month. If you are having worsening pain or feel that this isnt providing you with enough support, follow up sooner. I also recommend you try lacing up your shoe by skipping the lace lace hole to provide you with more toe-box room.  For your hip pain - get a 2lb ankle weight and continue the abductor exercises. I have also sent in a prescription for physical therapy.

## 2020-07-30 NOTE — Progress Notes (Signed)
Subjective:   Patient ID: Brent Chapman    DOB: [birth year], 65 y.o. male   MRN: [id number]  Brent Chapman is a 65 y.o. male with a history of left foot pain here for follow up. Patient has been seen multiple times for this issue, last seen on 02/16/20. He has history of pain and swelling of his dorsal foot that had resolved by last visit. He has also had pain in plantar fascia area that wasn't bothering him at that time. He has a left foot MRI which showed no obvious stress fracture in the metatarsals but he did have several incidental findings including peroneal tendinosis and achilles peritendinitis, as well as evidence of plantar fasciitis. During his last visit he was given the okay to start running as tolerated. Today he notes that he has developed lateral dorsal foot pain when he walks or runs. He notes that it all started about a month ago when he went running without his shoe insert.  Denies any new trauma but notes that every time he walks around he feels pain on the lateral dorsal dorsal aspect of his left foot.  He feels like he is walking more on the outside of his foot which he feels is contributing.  He also has a sensation that his shoe is too tight and that his foot is scrunched making the pain worse.  He does continue to have some plantar fascia pain in the morning but this resolves with his morning exercises.  Review of Systems:  Per HPI.   Objective:   BP 110/78   Ht 6\' 2"  (1.88 m)   Wt 198 lb (89.8 kg)   BMI 25.42 kg/m  Vitals and nursing note reviewed.  General: pleasant older male, sitting comfortably on exam bed, well nourished, well developed, in no acute distress with non-toxic appearance Resp: breathing comfortably on room air, speaking in full sentences Skin: warm, dry Extremities: warm and well perfused, normal tone MSK: appears to supinate when ambulating Left foot: inspection of foot appears normal without erythema or swelling, pain to palpation of distal  dorsal 5th metatarsal, not able to reproduce the pain with plantar palpation or compression of 5th MTP, normal ankle ROM and strength in all fields, no calcaneal pain, no plantar fascia pain, no achilles tendon pain Left Hip: pain to palpation over greater trochanter, 4/5 strength with abduction, normal trendelenburg  Neuro: Alert and oriented, speech normal  Assessment & Plan:   Left foot pain Acute. Dorsal distal 5th metatarsal pain with ambulation. Does appear to be connected with his supinating gait. Suspect exacerbated by arch support of insert. Low suspicion for stress fracture at this time. Remaining exam normal. Provided new insert with less arch support. Recommended skipping last shoe-lace eyelet to allow more toe-box room. Recommended follow up in 4 weeks to evaluate, sooner if appears no improvement or worsening.    Left hip pain Chronic. Still appears consistent with greater trochanter bursitis. Still has some weakness in hip abduction but gluteus medius strength seems improved. Recommended weight hip abduction exercises and trial of physical therapy. He was amendable to this plan. Follow up in 4 weeks.   No orders of the defined types were placed in this encounter.  No orders of the defined types were placed in this encounter.     , DO PGY-3, [facility] 07/30/2020 10:23 AM   Patient seen and evaluated with the resident.  I agree with the above plan of care.  I have given him a green sports insole without a scaphoid pad to try in his left shoe.  His supination is not as noticeable with this slight correction.  We will try this for 4 weeks and then follow-up again for reevaluation.  If he does not find his correction helpful over the next week or 2 that he can return to the office for the addition of a lateral heel wedge.  We will also refer him to [facility] for treatment of his left hip pain.

## 2021-08-02 ENCOUNTER — Other Ambulatory Visit: Payer: Self-pay | Admitting: Orthopaedic Surgery

## 2021-08-02 DIAGNOSIS — S46002A Unspecified injury of muscle(s) and tendon(s) of the rotator cuff of left shoulder, initial encounter: Secondary | ICD-10-CM

## 2021-08-13 ENCOUNTER — Ambulatory Visit
Admission: RE | Admit: 2021-08-13 | Discharge: 2021-08-13 | Disposition: A | Discharge status: Home / Self Care | Payer: BC Managed Care – PPO | Source: Ambulatory Visit | Attending: Orthopaedic Surgery | Admitting: Orthopaedic Surgery

## 2021-08-13 DIAGNOSIS — S46002A Unspecified injury of muscle(s) and tendon(s) of the rotator cuff of left shoulder, initial encounter: Secondary | ICD-10-CM

## 2022-11-06 ENCOUNTER — Encounter: Payer: Self-pay | Admitting: Family Medicine

## 2022-11-06 ENCOUNTER — Ambulatory Visit: Payer: BC Managed Care – PPO | Admitting: Family Medicine

## 2022-11-06 VITALS — BP 110/60 | Ht 74.0 in | Wt 190.0 lb

## 2022-11-06 DIAGNOSIS — M25462 Effusion, left knee: Secondary | ICD-10-CM | POA: Diagnosis not present

## 2022-11-06 DIAGNOSIS — M25562 Pain in left knee: Secondary | ICD-10-CM | POA: Diagnosis not present

## 2022-11-06 MED ORDER — METHYLPREDNISOLONE ACETATE 40 MG/ML IJ SUSP
40.0000 mg | Freq: Once | INTRAMUSCULAR | Status: AC
  Administered 2022-11-06: 40 mg via INTRA_ARTICULAR

## 2022-11-06 NOTE — Progress Notes (Signed)
DATE OF VISIT: 11/06/2022         Brent Chapman DOB: 10-14-1955 MRN: [id number]  CC:  Lt knee pain  CC/History of present Illness: Brent Chapman is a 67 y.o. male who presents complaining of LT knee pain.  Patient reports he is an avid runner, typically doing 3 miles several days a week.  He reports in June was on a run at a 10:30 min pace (his normal), decided to increase pace to for 2nd mile.  Felt ok during, but increased pain that night.  Noted pain along the top of his knee with associated decreased range of motion.  He rested for approximately 3 weeks, then gradually return to a walk run program and was able to get back to 3 miles.  Also able to do some biking.  The end of August he did a trail run and felt some increased pain.  He again needed to rest.  He then later started a weight program at the [facility].  He was doing leg press and started to have increasing pain immediately.  Since that time he has had ongoing pain and swelling.  He has been limping.  He has been using a knee sleeve.  He denies any clicking catching locking or instability.  He is unable to take NSAIDs due to history of stomach ulcer.  Has been using Tylenol extra strength as needed, but has not been helpful.  He has not had any imaging.  He has not had any prior issues with this knee.  Past Medical History:  Past Medical History:  Diagnosis Date   Gastritis    NSAID induced   GERD (gastroesophageal reflux disease)    Hyperlipidemia    Rotator cuff syndrome of left shoulder 01/10/2011   Classic impingement findings on exam    Stomach ulcer     Past Surgical History:  Past Surgical History:  Procedure Laterality Date   ESOPHAGOGASTRODUODENOSCOPY Left 12/12/2013   Procedure: ESOPHAGOGASTRODUODENOSCOPY (EGD);  Surgeon: Charna Elizabeth, MD;  Location: [facility];  Service: Endoscopy;  Laterality: Left;   INGUINAL HERNIA REPAIR Left    ORIF ELBOW FRACTURE Left 08/24/2019   Procedure: OPEN REDUCTION INTERNAL  FIXATION (ORIF) ELBOW/OLECRANON FRACTURE;  Surgeon: Sheral Apley, MD;  Location: [facility];  Service: Orthopedics;  Laterality: Left;    Medications:  Outpatient Encounter Medications as of 11/06/2022  Medication Sig   simvastatin (ZOCOR) 40 MG tablet Take 40 mg by mouth daily.   tadalafil (CIALIS) 20 MG tablet Take 20 mg by mouth daily as needed.   [DISCONTINUED] atorvastatin (LIPITOR) 20 MG tablet 20 mg daily.    [DISCONTINUED] nitroGLYCERIN (NITRODUR - DOSED IN MG/24 HR) 0.2 mg/hr patch Apply 1/4th patch to affected achilles, change daily   [DISCONTINUED] sildenafil (VIAGRA) 25 MG tablet Take 100 mg by mouth daily as needed for erectile dysfunction.   [EXPIRED] methylPREDNISolone acetate (DEPO-MEDROL) injection 40 mg    No facility-administered encounter medications on file as of 11/06/2022.    Allergies: is allergic to nsaids.  Social History:   reports current alcohol use.  reports that he has never smoked. He has never used smokeless tobacco.  reports no history of drug use. 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 male appearing their stated age, alert and oriented  x 3, in no apparent distress.  SKIN: no rashes or lesions, skin clean, dry, intact MSK: Knee: Lt knee clean/dry/ Intact skin. large effusion, no soft-tissue swelling, no  redness, no bruising.  No rashes.  (+) tenderness at medial joint line, no tenderness at lateral joint line, no tenderness over patealla tendon or quad tendon, no tenderness over patellar facets.  Mildly (+) McMurray with medial knee pain without palpable click, neg varus/valgus stress test, neg Lachman, neg posterior drawer.  Decreased range of motion 0-110 deg with pain & without crepitus.  Walking with a limp.  RT knee with full range of motion without pain, weakness, or instability.  No  calf tenderness.  No leg length difference.  Good ROM of hips bilaterally.  NEURO: sensation intact to light touch b/l VASC: pulses 2+ and symmetric dorsalis pedis bilaterally, no edema, no calf tenderness  Assessment: 1. Acute Lt knee pain with large effusion - suspect underlying OA and possible degenerative meniscus tear  Plan:  1. The patients condition was explained in detail.   We discussed different treatment options.  Including: watchful waiting vs aspiration/inj vs imaging vs PT - he would like to proceed with aspiration/injection - was completed today, see Procedure Note below for further details 2. IMAGING: LT KNEE XR 4-views r/o OA - will call or send MyChart message with results once available 3. Unable to take NSAIDs due to hx of stomach ulcer.  Cont Tylenol Extra-strength 1-2 tabs q 6-8 hr prn  4. Cont Ice prn 5. Cont knee sleeve prn 6.  Will determine f/u pending xray ressults.  Encouraged to call sooner with any questions/concerns.   7. Patient expressed understanding & agreement with above.  Encounter Diagnoses  Name Primary?   Acute pain of left knee Yes   Knee effusion, left     Orders Placed This Encounter  Procedures   DG Knee Complete 4 Views Left    Orders Placed This Encounter  Procedures   DG Knee Complete 4 Views Left   Knee Arthrocentesis without Injection Procedure Note PROCEDURE:  Risks & benefits of LT knee aspiration & cortisone injection reviewed. Consent obtained. Time-out completed. Patient prepped and draped in the normal fashion. Area cleansed with chlorhexidene. Ethyl chloride spray used to anesthetize the skin. Solution of 3 mL 1% lidocaine injected into superior lateral aspect of Lt knee for local anesthesia.  After ensuring adequate anesthesia an 18-g 1.5-inch needle on 60-mL syringe inserted into Lt knee via superior-lateral approach.  Aspirated 30 cc of yellow synovial fluid - was not sent for analysis.  Needle left in place, syringe  exchanged for syringe with solution of 4 mL 1% lidocaine with 1 mL methylprednisolone (Depo-medrol) 40mg /mL.  This was injected into the Lt knee without complication. Patient tolerated procedure well without any complications. Area covered with adhesive bandage. Post-procedure care reviewed.  All questions answered.

## 2022-11-06 NOTE — Patient Instructions (Signed)
Your left knee pain is likely related to osteoarthritis and possibly a degenerative meniscus tear. - We completed a left knee aspiration and cortisone injection today.  It may take several days or even up to 2 weeks to notice full effect. -You can continue to use Tylenol as needed to help with your pain -You can continue to use your knee sleeve as needed. - You can continue to ice as needed -You should rest from activity for the next 2 to 3 days, if improving okay to start with stationary bike. -We placed an order for you to obtain x-rays of the left knee.  I will reach out with the results once these are completed and discussed neck steps. -Please reach out with any questions or concerns.

## 2022-11-08 ENCOUNTER — Ambulatory Visit
Admission: RE | Admit: 2022-11-08 | Discharge: 2022-11-08 | Disposition: A | Discharge status: Home / Self Care | Payer: BC Managed Care – PPO | Source: Ambulatory Visit | Attending: Family Medicine | Admitting: Family Medicine

## 2022-11-08 DIAGNOSIS — M25562 Pain in left knee: Secondary | ICD-10-CM

## 2022-11-08 DIAGNOSIS — M25462 Effusion, left knee: Secondary | ICD-10-CM

## 2022-11-13 ENCOUNTER — Encounter: Payer: Self-pay | Admitting: Family Medicine

## 2022-11-13 ENCOUNTER — Other Ambulatory Visit: Payer: Self-pay

## 2022-11-13 DIAGNOSIS — M1712 Unilateral primary osteoarthritis, left knee: Secondary | ICD-10-CM | POA: Insufficient documentation

## 2023-11-22 ENCOUNTER — Other Ambulatory Visit: Payer: Self-pay

## 2023-11-22 DIAGNOSIS — I872 Venous insufficiency (chronic) (peripheral): Secondary | ICD-10-CM

## 2023-12-18 ENCOUNTER — Ambulatory Visit (HOSPITAL_COMMUNITY)
Admission: RE | Admit: 2023-12-18 | Discharge: 2023-12-18 | Disposition: A | Discharge status: Home / Self Care | Payer: Self-pay | Source: Ambulatory Visit | Attending: Vascular Surgery | Admitting: Vascular Surgery

## 2023-12-18 ENCOUNTER — Ambulatory Visit: Payer: Self-pay | Admitting: Physician Assistant

## 2023-12-18 VITALS — BP 125/82 | HR 57 | Temp 97.7°F | Wt 197.6 lb

## 2023-12-18 DIAGNOSIS — I872 Venous insufficiency (chronic) (peripheral): Secondary | ICD-10-CM | POA: Diagnosis present

## 2023-12-22 NOTE — Progress Notes (Signed)
 Requested by:  Arloa Elsie SAUNDERS, MD [phone] [address] [place],  [place] [address]  Reason for consultation: Left leg swelling   History of Present Illness   Brent Chapman is a 68 y.o. (04/24/1955) male who presents for evaluation left leg swelling.  He notes when he was at the beach this summer he started having issues with swelling in his left lower leg and foot after sitting down for long periods of time.  This caused his leg and foot to feel tingly and achy.  He says that he has never had issues with swelling prior to this summer.  Overall his swelling has moderately improved now that he is moving around more and it is cooler outside.  He says now he will only get swelling in his left lower leg and foot after standing in one place for too long.  He does wear knee-high compression stockings daily, which helps with his swelling and discomfort.  He does not regularly elevate his legs.  He denies any prior history of DVT or previous vein procedures.  He denies any bleeding events.  Past Medical History:  Diagnosis Date   Gastritis    NSAID induced   GERD (gastroesophageal reflux disease)    Hyperlipidemia    Rotator cuff syndrome of left shoulder 01/10/2011   Classic impingement findings on exam    Stomach ulcer     Past Surgical History:  Procedure Laterality Date   ESOPHAGOGASTRODUODENOSCOPY Left 12/12/2013   Procedure: ESOPHAGOGASTRODUODENOSCOPY (EGD);  Surgeon: Renaye Sous, MD;  Location: [facility];  Service: Endoscopy;  Laterality: Left;   INGUINAL HERNIA REPAIR Left    ORIF ELBOW FRACTURE Left 08/24/2019   Procedure: OPEN REDUCTION INTERNAL FIXATION (ORIF) ELBOW/OLECRANON FRACTURE;  Surgeon: Beverley Evalene BIRCH, MD;  Location: [facility];  Service: Orthopedics;  Laterality: Left;    Social History   Socioeconomic History   Marital status: Significant Other    Spouse name: Keene   Number of children: 2   Years of education: Not on file   Highest education  level: Not on file  Occupational History   Occupation: Tourist information centre manager  Tobacco Use   Smoking status: Never   Smokeless tobacco: Never  Vaping Use   Vaping status: Never Used  Substance and Sexual Activity   Alcohol use: Yes    Alcohol/week: 0.0 standard drinks of alcohol    Comment: 2 beers a week.   Drug use: No   Sexual activity: Not Currently  Other Topics Concern   Not on file  Social History Narrative   Married.  Independent of ADLs.   Social Drivers of Corporate Investment Banker Strain: Not on file  Food Insecurity: Not on file  Transportation Needs: Not on file  Physical Activity: Not on file  Stress: Not on file  Social Connections: Not on file  Intimate Partner Violence: Not on file    Family History  Problem Relation Age of Onset   Cancer Father        Prostate   Sudden death Neg Hx    Hypertension Neg Hx    Hyperlipidemia Neg Hx    Heart attack Neg Hx    Diabetes Neg Hx     Current Outpatient Medications  Medication Sig Dispense Refill   pantoprazole  (PROTONIX ) 40 MG tablet Take 40 mg by mouth every morning.     simvastatin (ZOCOR) 40 MG tablet Take 40 mg by mouth daily.     tadalafil (CIALIS)  20 MG tablet Take 20 mg by mouth daily as needed.     No current facility-administered medications for this visit.    Allergies  Allergen Reactions   Nsaids     Stomach ulcer    REVIEW OF SYSTEMS (negative unless checked):   Cardiac:  []  Chest pain or chest pressure? []  Shortness of breath upon activity? []  Shortness of breath when lying flat? []  Irregular heart rhythm?  Vascular:  []  Pain in calf, thigh, or hip brought on by walking? []  Pain in feet at night that wakes you up from your sleep? []  Blood clot in your veins? [x]  Leg swelling?  Pulmonary:  []  Oxygen at home? []  Productive cough? []  Wheezing?  Neurologic:  []  Sudden weakness in arms or legs? []  Sudden numbness in arms or legs? []  Sudden onset of difficult speaking  or slurred speech? []  Temporary loss of vision in one eye? []  Problems with dizziness?  Gastrointestinal:  []  Blood in stool? []  Vomited blood?  Genitourinary:  []  Burning when urinating? []  Blood in urine?  Psychiatric:  []  Major depression  Hematologic:  []  Bleeding problems? []  Problems with blood clotting?  Dermatologic:  []  Rashes or ulcers?  Constitutional:  []  Fever or chills?  Ear/Nose/Throat:  []  Change in hearing? []  Nose bleeds? []  Sore throat?  Musculoskeletal:  []  Back pain? []  Joint pain? []  Muscle pain?   Physical Examination     Vitals:   12/18/23 1257  BP: 125/82  Pulse: (!) 57  Temp: 97.7 F (36.5 C)  TempSrc: Temporal  Weight: 197 lb 9.6 oz (89.6 kg)   Body mass index is 25.37 kg/m.  General:  WDWN in NAD; vital signs documented above Gait: Not observed HENT: WNL, normocephalic Pulmonary: normal non-labored breathing Cardiac: Regular Abdomen: soft, NT, no masses Skin: without rashes Vascular Exam/Pulses: Palpable DP pulses bilaterally Extremities: LLE with varicose veins, with reticular veins, with edema, with stasis pigmentation, without lipodermatosclerosis, without ulcers Musculoskeletal: no muscle wasting or atrophy  Neurologic: A&O X 3;  No focal weakness or paresthesias are detected Psychiatric:  The pt has Normal affect.  Non-invasive Vascular Imaging   LLE Venous Insufficiency Duplex (12/18/2023):  LEFT          Reflux NoRefluxReflux TimeDiameter cmsComments                          Yes                                   +--------------+---------+------+-----------+------------+--------+  CFV                    yes   >1 second                       +--------------+---------+------+-----------+------------+--------+  FV mid        no                                              +--------------+---------+------+-----------+------------+--------+  Popliteal    no                                               +--------------+---------+------+-----------+------------+--------+  GSV at [facility]              yes    >500 ms      .61               +--------------+---------+------+-----------+------------+--------+  GSV prox thigh          yes    >500 ms      .44               +--------------+---------+------+-----------+------------+--------+  GSV mid thigh           yes    >500 ms      .66               +--------------+---------+------+-----------+------------+--------+  GSV dist thigh          yes    >500 ms      .65               +--------------+---------+------+-----------+------------+--------+  GSV at knee             yes    >500 ms      .53               +--------------+---------+------+-----------+------------+--------+  GSV prox calf           yes    >500 ms      .31               +--------------+---------+------+-----------+------------+--------+  SSV at [facility]    no                            .46               +--------------+---------+------+-----------+------------+--------+  SSV prox calf no                            .32               +--------------+---------+------+-----------+------------+--------+  Medical Decision Making   Brent Chapman is a 68 y.o. male who presents for evaluation of leg swelling  Based on the patient's duplex, there is reflux in the left common femoral vein and greater saphenous vein from the saphenofemoral junction to the proximal calf.  The remainder of the patient's deep and superficial venous system on the left is competent.  There is no evidence of DVT or SVT.  He could be a potential future candidate for saphenous vein ablation if needed The patient states this summer he started having swelling in his left lower leg and foot.  His swelling would pop up later on in the day after sitting around at the beach.  This caused his foot to feel tingly and achy.  He says his swelling has  moderately improved as we are moving into the winter.  He has also started wearing compression stockings daily, which also helps with his swelling.  He notes now most of his swelling happens if he is standing in one place for too long. He also endorses a history of left lower extremity varicosities, however these are not painful to him.  He denies any prior history of DVT or bleeding events.  On exam he has 1+ pitting edema of the left lower leg and ankle.  He has palpable pedal pulses bilaterally.  He also has stasis pigmentation around his shins and ankles bilaterally.  He has some small varicose veins in the left leg  as well. I have explained to the patient that he does have evidence of venous insufficiency, which is likely the cause of his leg swelling.  I have reassured the patient that this is not limb or life-threatening.  I have explained that he is a candidate for vein ablation, however I do not think he warrants intervention at this time given that his symptoms are controlled with conservative therapy.  I have encouraged daily use of compression stockings, leg elevation, exercise, and avoiding prolonged sitting and standing. He can follow-up with our office as needed  Ahmed SHAUNNA Holster, PA-C [facility] Office: [phone]  Clinic MD: Miguel
